# Patient Record
Sex: Male | Born: 1945 | Race: White | Hispanic: No | Marital: Married | Smoking: Never smoker
Health system: Southern US, Community
[De-identification: ages and names within clinical notes are randomized; demographics above are authoritative.]

## PROBLEM LIST (undated history)

## (undated) DIAGNOSIS — K219 Gastro-esophageal reflux disease without esophagitis: Secondary | ICD-10-CM

## (undated) DIAGNOSIS — R5383 Other fatigue: Secondary | ICD-10-CM

## (undated) DIAGNOSIS — I10 Essential (primary) hypertension: Secondary | ICD-10-CM

## (undated) DIAGNOSIS — E119 Type 2 diabetes mellitus without complications: Secondary | ICD-10-CM

## (undated) DIAGNOSIS — C801 Malignant (primary) neoplasm, unspecified: Secondary | ICD-10-CM

## (undated) HISTORY — PX: COLONOSCOPY: SHX174

## (undated) HISTORY — PX: VASECTOMY: SHX75

## (undated) HISTORY — PX: PARATHYROIDECTOMY: SHX19

---

## 2013-06-27 DIAGNOSIS — E213 Hyperparathyroidism, unspecified: Secondary | ICD-10-CM | POA: Insufficient documentation

## 2013-08-18 DIAGNOSIS — N400 Enlarged prostate without lower urinary tract symptoms: Secondary | ICD-10-CM | POA: Insufficient documentation

## 2013-08-18 DIAGNOSIS — E1165 Type 2 diabetes mellitus with hyperglycemia: Secondary | ICD-10-CM | POA: Insufficient documentation

## 2013-08-18 DIAGNOSIS — E785 Hyperlipidemia, unspecified: Secondary | ICD-10-CM | POA: Insufficient documentation

## 2015-07-04 ENCOUNTER — Encounter: Payer: Self-pay | Admitting: *Deleted

## 2015-07-04 DIAGNOSIS — K219 Gastro-esophageal reflux disease without esophagitis: Secondary | ICD-10-CM | POA: Diagnosis not present

## 2015-07-04 DIAGNOSIS — H2512 Age-related nuclear cataract, left eye: Secondary | ICD-10-CM | POA: Diagnosis not present

## 2015-07-04 DIAGNOSIS — I10 Essential (primary) hypertension: Secondary | ICD-10-CM | POA: Diagnosis not present

## 2015-07-04 DIAGNOSIS — I2510 Atherosclerotic heart disease of native coronary artery without angina pectoris: Secondary | ICD-10-CM | POA: Diagnosis not present

## 2015-07-04 DIAGNOSIS — E119 Type 2 diabetes mellitus without complications: Secondary | ICD-10-CM | POA: Diagnosis not present

## 2015-07-04 DIAGNOSIS — Z8546 Personal history of malignant neoplasm of prostate: Secondary | ICD-10-CM | POA: Diagnosis not present

## 2015-07-04 DIAGNOSIS — I252 Old myocardial infarction: Secondary | ICD-10-CM | POA: Diagnosis not present

## 2015-07-10 ENCOUNTER — Ambulatory Visit
Admission: RE | Admit: 2015-07-10 | Discharge: 2015-07-10 | Disposition: A | Discharge status: Home / Self Care | Payer: Medicare Other | Source: Ambulatory Visit | Attending: Ophthalmology | Admitting: Ophthalmology

## 2015-07-10 ENCOUNTER — Encounter
Admission: RE | Disposition: A | Discharge status: Home / Self Care | Payer: Self-pay | Source: Ambulatory Visit | Attending: Ophthalmology

## 2015-07-10 ENCOUNTER — Ambulatory Visit: Payer: Medicare Other | Admitting: Registered Nurse

## 2015-07-10 ENCOUNTER — Encounter: Payer: Self-pay | Admitting: *Deleted

## 2015-07-10 DIAGNOSIS — K219 Gastro-esophageal reflux disease without esophagitis: Secondary | ICD-10-CM | POA: Insufficient documentation

## 2015-07-10 DIAGNOSIS — H2512 Age-related nuclear cataract, left eye: Secondary | ICD-10-CM | POA: Diagnosis not present

## 2015-07-10 DIAGNOSIS — I2510 Atherosclerotic heart disease of native coronary artery without angina pectoris: Secondary | ICD-10-CM | POA: Insufficient documentation

## 2015-07-10 DIAGNOSIS — E119 Type 2 diabetes mellitus without complications: Secondary | ICD-10-CM | POA: Insufficient documentation

## 2015-07-10 DIAGNOSIS — Z8546 Personal history of malignant neoplasm of prostate: Secondary | ICD-10-CM | POA: Insufficient documentation

## 2015-07-10 DIAGNOSIS — I252 Old myocardial infarction: Secondary | ICD-10-CM | POA: Insufficient documentation

## 2015-07-10 DIAGNOSIS — I10 Essential (primary) hypertension: Secondary | ICD-10-CM | POA: Insufficient documentation

## 2015-07-10 HISTORY — DX: Essential (primary) hypertension: I10

## 2015-07-10 HISTORY — DX: Gastro-esophageal reflux disease without esophagitis: K21.9

## 2015-07-10 HISTORY — PX: CATARACT EXTRACTION W/PHACO: SHX586

## 2015-07-10 HISTORY — DX: Other fatigue: R53.83

## 2015-07-10 HISTORY — DX: Type 2 diabetes mellitus without complications: E11.9

## 2015-07-10 HISTORY — DX: Malignant (primary) neoplasm, unspecified: C80.1

## 2015-07-10 LAB — GLUCOSE, CAPILLARY: Glucose-Capillary: 159 mg/dL — ABNORMAL HIGH (ref 65–99)

## 2015-07-10 SURGERY — PHACOEMULSIFICATION, CATARACT, WITH IOL INSERTION
Anesthesia: General | Laterality: Left | Wound class: Clean

## 2015-07-10 MED ORDER — ONDANSETRON HCL 4 MG/2ML IJ SOLN
INTRAMUSCULAR | Status: DC | PRN
  Administered 2015-07-10: 4 mg via INTRAVENOUS

## 2015-07-10 MED ORDER — SODIUM CHLORIDE 0.9 % IV SOLN
INTRAVENOUS | Status: DC
  Administered 2015-07-10: 06:00:00 via INTRAVENOUS

## 2015-07-10 MED ORDER — NA CHONDROIT SULF-NA HYALURON 40-17 MG/ML IO SOLN
INTRAOCULAR | Status: DC | PRN
  Administered 2015-07-10: 1 mL via INTRAOCULAR

## 2015-07-10 MED ORDER — EPINEPHRINE HCL 1 MG/ML IJ SOLN
INTRAMUSCULAR | Status: AC
  Filled 2015-07-10: qty 1

## 2015-07-10 MED ORDER — FENTANYL CITRATE (PF) 100 MCG/2ML IJ SOLN
INTRAMUSCULAR | Status: DC | PRN
  Administered 2015-07-10: 50 ug via INTRAVENOUS

## 2015-07-10 MED ORDER — NA CHONDROIT SULF-NA HYALURON 40-17 MG/ML IO SOLN
INTRAOCULAR | Status: AC
  Filled 2015-07-10: qty 1

## 2015-07-10 MED ORDER — MOXIFLOXACIN HCL 0.5 % OP SOLN: 1.0000 [drp] | OPHTHALMIC | Status: DC | PRN

## 2015-07-10 MED ORDER — EPINEPHRINE HCL 1 MG/ML IJ SOLN
INTRAOCULAR | Status: DC | PRN
  Administered 2015-07-10: 250 mL via OPHTHALMIC

## 2015-07-10 MED ORDER — ARMC OPHTHALMIC DILATING GEL
1.0000 "application " | OPHTHALMIC | Status: AC | PRN
  Administered 2015-07-10 (×2): 1 via OPHTHALMIC

## 2015-07-10 MED ORDER — CEFUROXIME OPHTHALMIC INJECTION 1 MG/0.1 ML
INJECTION | OPHTHALMIC | Status: AC
  Filled 2015-07-10: qty 0.1

## 2015-07-10 MED ORDER — POVIDONE-IODINE 5 % OP SOLN
1.0000 "application " | OPHTHALMIC | Status: AC | PRN
  Administered 2015-07-10: 1 via OPHTHALMIC

## 2015-07-10 MED ORDER — TETRACAINE HCL 0.5 % OP SOLN
1.0000 [drp] | OPHTHALMIC | Status: AC | PRN
  Administered 2015-07-10: 1 [drp] via OPHTHALMIC

## 2015-07-10 MED ORDER — MIDAZOLAM HCL 2 MG/2ML IJ SOLN
INTRAMUSCULAR | Status: DC | PRN
  Administered 2015-07-10: 1 mg via INTRAVENOUS

## 2015-07-10 MED ORDER — MOXIFLOXACIN HCL 0.5 % OP SOLN
OPHTHALMIC | Status: DC | PRN
  Administered 2015-07-10: 2 [drp] via OPHTHALMIC

## 2015-07-10 SURGICAL SUPPLY — 21 items
CANNULA ANT/CHMB 27GA (MISCELLANEOUS) IMPLANT
CUP MEDICINE 2OZ PLAST GRAD ST (MISCELLANEOUS) IMPLANT
GLOVE BIO SURGEON STRL SZ8 (GLOVE) ×3 IMPLANT
GLOVE BIOGEL M 6.5 STRL (GLOVE) ×3 IMPLANT
GLOVE SURG LX 8.0 MICRO (GLOVE) ×2
GLOVE SURG LX STRL 8.0 MICRO (GLOVE) ×1 IMPLANT
GOWN STRL REUS W/ TWL LRG LVL3 (GOWN DISPOSABLE) ×2 IMPLANT
GOWN STRL REUS W/TWL LRG LVL3 (GOWN DISPOSABLE) ×4
LENS IOL TECNIS 14.0 (Intraocular Lens) ×3 IMPLANT
PACK CATARACT (MISCELLANEOUS) ×3 IMPLANT
PACK CATARACT BRASINGTON LX (MISCELLANEOUS) ×3 IMPLANT
PACK EYE AFTER SURG (MISCELLANEOUS) ×3 IMPLANT
SOL BSS BAG (MISCELLANEOUS) ×3
SOL PREP PVP 2OZ (MISCELLANEOUS)
SOLUTION BSS BAG (MISCELLANEOUS) ×1 IMPLANT
SOLUTION PREP PVP 2OZ (MISCELLANEOUS) IMPLANT
SYR 3ML LL SCALE MARK (SYRINGE) IMPLANT
SYR 5ML LL (SYRINGE) ×3 IMPLANT
SYR TB 1ML 27GX1/2 LL (SYRINGE) ×3 IMPLANT
WATER STERILE IRR 1000ML POUR (IV SOLUTION) ×3 IMPLANT
WIPE NON LINTING 3.25X3.25 (MISCELLANEOUS) ×3 IMPLANT

## 2015-07-10 NOTE — Anesthesia Preprocedure Evaluation (Addendum)
Anesthesia Evaluation  Patient identified by MRN, date of birth, ID band Patient awake    Reviewed: Allergy & Precautions, H&P , NPO status , Patient's Chart, lab work & pertinent test results, reviewed documented beta blocker date and time   History of Anesthesia Complications Negative for: history of anesthetic complications  Airway Mallampati: II  TM Distance: >3 FB Neck ROM: full    Dental no notable dental hx. (+) Teeth Intact   Pulmonary neg pulmonary ROS,  breath sounds clear to auscultation  Pulmonary exam normal       Cardiovascular Exercise Tolerance: Good hypertension, - angina- CAD, - Past MI, - Cardiac Stents and - CABG Normal cardiovascular exam- dysrhythmias - Valvular Problems/MurmursRhythm:regular Rate:Normal     Neuro/Psych negative neurological ROS  negative psych ROS   GI/Hepatic Neg liver ROS, GERD-  Medicated,  Endo/Other  diabetes, Oral Hypoglycemic Agents  Renal/GU negative Renal ROS  negative genitourinary   Musculoskeletal   Abdominal   Peds  Hematology negative hematology ROS (+)   Anesthesia Other Findings Past Medical History:   Hypertension                                                 Fatigue                                                        Comment:POSSIBLE SEIZURE  NO EVAL  NO FUTHER PROBLEM   GERD (gastroesophageal reflux disease)                         Comment:ULCERS   Diabetes mellitus without complication                       Cancer                                                         Comment:PROSTATE   Reproductive/Obstetrics negative OB ROS                            Anesthesia Physical Anesthesia Plan  ASA: II  Anesthesia Plan: MAC   Post-op Pain Management:    Induction:   Airway Management Planned:   Additional Equipment:   Intra-op Plan:   Post-operative Plan:   Informed Consent: I have reviewed the patients History  and Physical, chart, labs and discussed the procedure including the risks, benefits and alternatives for the proposed anesthesia with the patient or authorized representative who has indicated his/her understanding and acceptance.   Dental Advisory Given  Plan Discussed with: Anesthesiologist, CRNA and Surgeon  Anesthesia Plan Comments:        Anesthesia Quick Evaluation

## 2015-07-10 NOTE — Anesthesia Procedure Notes (Signed)
Procedure Name: MAC Date/Time: 07/10/2015 7:30 AM Performed by: Doreen Salvage Pre-anesthesia Checklist: Patient identified, Emergency Drugs available, Suction available and Patient being monitored Patient Re-evaluated:Patient Re-evaluated prior to inductionOxygen Delivery Method: Nasal cannula

## 2015-07-10 NOTE — Transfer of Care (Signed)
Immediate Anesthesia Transfer of Care Note  Patient: [place] Nails Bedore  Procedure(s) Performed: Procedure(s) with comments: CATARACT EXTRACTION PHACO AND INTRAOCULAR LENS PLACEMENT (IOC) (Left) - [place]    02:00 AP% 67.6 CDE35.48 [id number] H  Patient Location: PHASE II  Anesthesia Type:MAC  Level of Consciousness: Awake, Alert, Oriented  Airway & Oxygen Therapy: Patient Spontanous Breathing and Patient on room air   Post-op Assessment: Report given to RN and Post -op Vital signs reviewed and stable  Post vital signs: Reviewed and stable  Last Vitals:  Filed Vitals:   07/10/15 0753  BP: 123/80  Pulse: 68  Temp: 36.2 C  Resp: 16    Complications: No apparent anesthesia complications

## 2015-07-10 NOTE — H&P (Signed)
  All labs reviewed. Abnormal studies sent to patients PCP when indicated.  Previous H&P reviewed, patient examined, there are NO CHANGES.  Jesse Curtis LOUIS9/6/20167:12 AM

## 2015-07-10 NOTE — Anesthesia Postprocedure Evaluation (Signed)
  Anesthesia Post-op Note  Patient: Jesse Curtis  Procedure(s) Performed: Procedure(s) with comments: CATARACT EXTRACTION PHACO AND INTRAOCULAR LENS PLACEMENT (IOC) (Left) - [place]    02:00 AP% 67.6 CDE35.48 [id number] H  Anesthesia type:MAC  Patient location: PHASE II  Post pain: Pain level controlled  Post assessment: Post-op Vital signs reviewed, Patient's Cardiovascular Status Stable, Respiratory Function Stable, Patent Airway and No signs of Nausea or vomiting  Post vital signs: Reviewed and stable  Last Vitals:  Filed Vitals:   07/10/15 0753  BP: 123/80  Pulse: 68  Temp: 36.2 C  Resp: 16    Level of consciousness: awake, alert  and patient cooperative  Complications: No apparent anesthesia complications

## 2015-07-10 NOTE — Op Note (Signed)
PREOPERATIVE DIAGNOSIS:  Nuclear sclerotic cataract of the left eye.   POSTOPERATIVE DIAGNOSIS:  nuclear sclerotic catarct left eye   OPERATIVE PROCEDURE:  Procedure(s): CATARACT EXTRACTION PHACO AND INTRAOCULAR LENS PLACEMENT (IOC)   SURGEON:  Birder Robson, MD.   ANESTHESIA:   Anesthesiologist: Martha Clan, MD CRNA: Doreen Salvage, CRNA  1.      Managed anesthesia care. 2.      Topical tetracaine drops followed by 2% Xylocaine jelly applied in the preoperative holding area.   COMPLICATIONS:  None.   TECHNIQUE:   Stop and chop   DESCRIPTION OF PROCEDURE:  The patient was examined and consented in the preoperative holding area where the aforementioned topical anesthesia was applied to the left eye and then brought back to the Operating Room where the left eye was prepped and draped in the usual sterile ophthalmic fashion and a lid speculum was placed. A paracentesis was created with the side port blade and the anterior chamber was filled with viscoelastic. A near clear corneal incision was performed with the steel keratome. A continuous curvilinear capsulorrhexis was performed with a cystotome followed by the capsulorrhexis forceps. Hydrodissection and hydrodelineation were carried out with BSS on a blunt cannula. The lens was removed in a stop and chop  technique and the remaining cortical material was removed with the irrigation-aspiration handpiece. The capsular bag was inflated with viscoelastic and the Technis ZCB00 lens was placed in the capsular bag without complication. The remaining viscoelastic was removed from the eye with the irrigation-aspiration handpiece. The wounds were hydrated. The anterior chamber was flushed with Miostat and the eye was inflated to physiologic pressure. 0.1 mL of cefuroxime concentration 10 mg/mL was placed in the anterior chamber. The wounds were found to be water tight. The eye was dressed with Vigamox. The patient was given protective glasses to wear  throughout the day and a shield with which to sleep tonight. The patient was also given drops with which to begin a drop regimen today and will follow-up with me in one day.  Implant Name Type Inv. Item Serial No. Manufacturer Lot No. LRB No. Used  LENS IOL TECNIS 14.0 - [id number] Intraocular Lens LENS IOL TECNIS 14.0 [id number] AMO   Left 1   Procedure(s) with comments: CATARACT EXTRACTION PHACO AND INTRAOCULAR LENS PLACEMENT (IOC) (Left) - [place]    02:00 AP% 67.6 CDE35.48 [id number] H  Electronically signed: [place] 07/10/2015 7:48 AM

## 2015-07-10 NOTE — Discharge Instructions (Signed)
Eye Surgery Discharge Instructions  Expect mild scratchy sensation or mild soreness. DO NOT RUB YOUR EYE!  The day of surgery:  Minimal physical activity, but bed rest is not required  No reading, computer work, or close hand work  No bending, lifting, or straining.  May watch TV  For 24 hours:  No driving, legal decisions, or alcoholic beverages  Safety precautions  Eat anything you prefer: It is better to start with liquids, then soup then solid foods.  _____ Eye patch should be worn until postoperative exam tomorrow.  ____ Solar shield eyeglasses should be worn for comfort in the sunlight/patch while sleeping  Resume all regular medications including aspirin or Coumadin if these were discontinued prior to surgery. You may shower, bathe, shave, or wash your hair. Tylenol may be taken for mild discomfort.  Call your doctor if you experience significant pain, nausea, or vomiting, fever > 101 or other signs of infection. [phone] or [phone] Specific instructions:  Follow-up Information    Please follow up.   Why:  Tomorrow Sept. 7th at 10:00 am

## 2015-07-26 ENCOUNTER — Encounter: Payer: Self-pay | Admitting: *Deleted

## 2015-07-31 ENCOUNTER — Ambulatory Visit: Payer: Medicare Other | Admitting: Anesthesiology

## 2015-07-31 ENCOUNTER — Ambulatory Visit
Admission: RE | Admit: 2015-07-31 | Discharge: 2015-07-31 | Disposition: A | Discharge status: Home / Self Care | Payer: Medicare Other | Source: Ambulatory Visit | Attending: Ophthalmology | Admitting: Ophthalmology

## 2015-07-31 ENCOUNTER — Encounter: Payer: Self-pay | Admitting: *Deleted

## 2015-07-31 ENCOUNTER — Encounter
Admission: RE | Disposition: A | Discharge status: Home / Self Care | Payer: Self-pay | Source: Ambulatory Visit | Attending: Ophthalmology

## 2015-07-31 DIAGNOSIS — K219 Gastro-esophageal reflux disease without esophagitis: Secondary | ICD-10-CM | POA: Insufficient documentation

## 2015-07-31 DIAGNOSIS — I10 Essential (primary) hypertension: Secondary | ICD-10-CM | POA: Diagnosis not present

## 2015-07-31 DIAGNOSIS — E785 Hyperlipidemia, unspecified: Secondary | ICD-10-CM | POA: Diagnosis not present

## 2015-07-31 DIAGNOSIS — R5383 Other fatigue: Secondary | ICD-10-CM | POA: Insufficient documentation

## 2015-07-31 DIAGNOSIS — Z9852 Vasectomy status: Secondary | ICD-10-CM | POA: Diagnosis not present

## 2015-07-31 DIAGNOSIS — E119 Type 2 diabetes mellitus without complications: Secondary | ICD-10-CM | POA: Diagnosis not present

## 2015-07-31 DIAGNOSIS — Z8546 Personal history of malignant neoplasm of prostate: Secondary | ICD-10-CM | POA: Insufficient documentation

## 2015-07-31 DIAGNOSIS — H2511 Age-related nuclear cataract, right eye: Secondary | ICD-10-CM | POA: Insufficient documentation

## 2015-07-31 DIAGNOSIS — Z9842 Cataract extraction status, left eye: Secondary | ICD-10-CM | POA: Diagnosis not present

## 2015-07-31 HISTORY — PX: CATARACT EXTRACTION W/PHACO: SHX586

## 2015-07-31 LAB — GLUCOSE, CAPILLARY: Glucose-Capillary: 114 mg/dL — ABNORMAL HIGH (ref 65–99)

## 2015-07-31 SURGERY — PHACOEMULSIFICATION, CATARACT, WITH IOL INSERTION
Anesthesia: Monitor Anesthesia Care | Laterality: Right | Wound class: Clean

## 2015-07-31 MED ORDER — POVIDONE-IODINE 5 % OP SOLN
OPHTHALMIC | Status: AC
  Administered 2015-07-31: 1 via OPHTHALMIC
  Filled 2015-07-31: qty 30

## 2015-07-31 MED ORDER — ARMC OPHTHALMIC DILATING GEL
OPHTHALMIC | Status: AC
  Administered 2015-07-31: 1 via OPHTHALMIC
  Filled 2015-07-31: qty 0.25

## 2015-07-31 MED ORDER — CEFUROXIME OPHTHALMIC INJECTION 1 MG/0.1 ML
INJECTION | OPHTHALMIC | Status: AC
  Filled 2015-07-31: qty 0.1

## 2015-07-31 MED ORDER — NA CHONDROIT SULF-NA HYALURON 40-17 MG/ML IO SOLN
INTRAOCULAR | Status: AC
  Filled 2015-07-31: qty 1

## 2015-07-31 MED ORDER — EPINEPHRINE HCL 1 MG/ML IJ SOLN
INTRAMUSCULAR | Status: AC
  Filled 2015-07-31: qty 1

## 2015-07-31 MED ORDER — NA CHONDROIT SULF-NA HYALURON 40-17 MG/ML IO SOLN
INTRAOCULAR | Status: DC | PRN
Start: 2015-07-31 — End: 2015-07-31
  Administered 2015-07-31: 1 mL via INTRAOCULAR

## 2015-07-31 MED ORDER — MOXIFLOXACIN HCL 0.5 % OP SOLN
OPHTHALMIC | Status: DC | PRN
  Administered 2015-07-31: 1 [drp]

## 2015-07-31 MED ORDER — POVIDONE-IODINE 5 % OP SOLN
1.0000 "application " | OPHTHALMIC | Status: AC | PRN
  Administered 2015-07-31: 1 via OPHTHALMIC

## 2015-07-31 MED ORDER — MIDAZOLAM HCL 2 MG/2ML IJ SOLN
INTRAMUSCULAR | Status: DC | PRN
  Administered 2015-07-31: 2 mg via INTRAVENOUS

## 2015-07-31 MED ORDER — MOXIFLOXACIN HCL 0.5 % OP SOLN
OPHTHALMIC | Status: AC
  Filled 2015-07-31: qty 3

## 2015-07-31 MED ORDER — ARMC OPHTHALMIC DILATING GEL
1.0000 "application " | OPHTHALMIC | Status: DC | PRN
  Administered 2015-07-31: 1 via OPHTHALMIC

## 2015-07-31 MED ORDER — EPINEPHRINE HCL 1 MG/ML IJ SOLN
INTRAOCULAR | Status: DC | PRN
  Administered 2015-07-31: 200 mL via OPHTHALMIC

## 2015-07-31 MED ORDER — SODIUM CHLORIDE 0.9 % IV SOLN
INTRAVENOUS | Status: DC
  Administered 2015-07-31 (×2): via INTRAVENOUS

## 2015-07-31 MED ORDER — CEFUROXIME OPHTHALMIC INJECTION 1 MG/0.1 ML
INJECTION | OPHTHALMIC | Status: DC | PRN
  Administered 2015-07-31: 1 mg via INTRACAMERAL

## 2015-07-31 MED ORDER — TETRACAINE HCL 0.5 % OP SOLN
1.0000 [drp] | OPHTHALMIC | Status: AC | PRN
  Administered 2015-07-31: 1 [drp] via OPHTHALMIC

## 2015-07-31 MED ORDER — TETRACAINE HCL 0.5 % OP SOLN
OPHTHALMIC | Status: AC
  Administered 2015-07-31: 1 [drp] via OPHTHALMIC
  Filled 2015-07-31: qty 2

## 2015-07-31 SURGICAL SUPPLY — 22 items
CANNULA ANT/CHMB 27GA (MISCELLANEOUS) ×3 IMPLANT
CUP MEDICINE 2OZ PLAST GRAD ST (MISCELLANEOUS) ×3 IMPLANT
GLOVE BIO SURGEON STRL SZ8 (GLOVE) ×3 IMPLANT
GLOVE BIOGEL M 6.5 STRL (GLOVE) ×3 IMPLANT
GLOVE SURG LX 8.0 MICRO (GLOVE) ×2
GLOVE SURG LX STRL 8.0 MICRO (GLOVE) ×1 IMPLANT
GOWN STRL REUS W/ TWL LRG LVL3 (GOWN DISPOSABLE) ×2 IMPLANT
GOWN STRL REUS W/TWL LRG LVL3 (GOWN DISPOSABLE) ×4
LENS IOL TECNIS 15.0 (Intraocular Lens) ×3 IMPLANT
LENS IOL TECNIS MONO 1P 15.0 (Intraocular Lens) ×1 IMPLANT
PACK CATARACT (MISCELLANEOUS) ×3 IMPLANT
PACK CATARACT BRASINGTON LX (MISCELLANEOUS) ×3 IMPLANT
PACK EYE AFTER SURG (MISCELLANEOUS) ×3 IMPLANT
SOL BSS BAG (MISCELLANEOUS) ×3
SOL PREP PVP 2OZ (MISCELLANEOUS) ×3
SOLUTION BSS BAG (MISCELLANEOUS) ×1 IMPLANT
SOLUTION PREP PVP 2OZ (MISCELLANEOUS) ×1 IMPLANT
SYR 3ML LL SCALE MARK (SYRINGE) ×3 IMPLANT
SYR 5ML LL (SYRINGE) ×3 IMPLANT
SYR TB 1ML 27GX1/2 LL (SYRINGE) ×3 IMPLANT
WATER STERILE IRR 1000ML POUR (IV SOLUTION) ×3 IMPLANT
WIPE NON LINTING 3.25X3.25 (MISCELLANEOUS) ×3 IMPLANT

## 2015-07-31 NOTE — Anesthesia Preprocedure Evaluation (Signed)
Anesthesia Evaluation  Patient identified by MRN, date of birth, ID band Patient awake    Reviewed: Allergy & Precautions, H&P , NPO status , Patient's Chart, lab work & pertinent test results, reviewed documented beta blocker date and time   History of Anesthesia Complications Negative for: history of anesthetic complications  Airway Mallampati: II  TM Distance: >3 FB Neck ROM: full    Dental no notable dental hx. (+) Teeth Intact   Pulmonary neg pulmonary ROS,    Pulmonary exam normal breath sounds clear to auscultation       Cardiovascular Exercise Tolerance: Good hypertension, (-) angina(-) CAD, (-) Past MI, (-) Cardiac Stents and (-) CABG Normal cardiovascular exam(-) dysrhythmias (-) Valvular Problems/Murmurs Rhythm:regular Rate:Normal     Neuro/Psych negative neurological ROS  negative psych ROS   GI/Hepatic Neg liver ROS, GERD  Medicated,  Endo/Other  diabetes, Oral Hypoglycemic Agents  Renal/GU negative Renal ROS  negative genitourinary   Musculoskeletal   Abdominal   Peds  Hematology negative hematology ROS (+)   Anesthesia Other Findings Past Medical History:   Hypertension                                                 Fatigue                                                        Comment:POSSIBLE SEIZURE  NO EVAL  NO FUTHER PROBLEM   GERD (gastroesophageal reflux disease)                         Comment:ULCERS   Diabetes mellitus without complication                       Cancer                                                         Comment:PROSTATE   Reproductive/Obstetrics negative OB ROS                             Anesthesia Physical  Anesthesia Plan  ASA: II  Anesthesia Plan: MAC   Post-op Pain Management:    Induction:   Airway Management Planned:   Additional Equipment:   Intra-op Plan:   Post-operative Plan:   Informed Consent: I have reviewed  the patients History and Physical, chart, labs and discussed the procedure including the risks, benefits and alternatives for the proposed anesthesia with the patient or authorized representative who has indicated his/her understanding and acceptance.   Dental Advisory Given  Plan Discussed with: Anesthesiologist, CRNA and Surgeon  Anesthesia Plan Comments:         Anesthesia Quick Evaluation

## 2015-07-31 NOTE — H&P (Signed)
  All labs reviewed. Abnormal studies sent to patients PCP when indicated.  Previous H&P reviewed, patient examined, there are NO CHANGES.  Jesse Curtis,Jesse LOUIS9/27/20169:28 AM

## 2015-07-31 NOTE — Discharge Instructions (Signed)
Eye Surgery Discharge Instructions  Expect mild scratchy sensation or mild soreness. DO NOT RUB YOUR EYE!  The day of surgery:  Minimal physical activity, but bed rest is not required  No reading, computer work, or close hand work  No bending, lifting, or straining.  May watch TV  For 24 hours:  No driving, legal decisions, or alcoholic beverages  Safety precautions  Eat anything you prefer: It is better to start with liquids, then soup then solid foods.  _____ Eye patch should be worn until postoperative exam tomorrow.  ____ Solar shield eyeglasses should be worn for comfort in the sunlight/patch while sleeping  Resume all regular medications including aspirin or Coumadin if these were discontinued prior to surgery. You may shower, bathe, shave, or wash your hair. Tylenol may be taken for mild discomfort.  Call your doctor if you experience significant pain, nausea, or vomiting, fever > 101 or other signs of infection. [phone] or [phone] Specific instructions:  Follow-up Information    Follow up with Tim Lair, MD.   Specialty:  Ophthalmology   Why:  tomorrow Sept 28th at 10:50   Contact information:   [address] [place] [address] [phone]

## 2015-07-31 NOTE — Transfer of Care (Signed)
Immediate Anesthesia Transfer of Care Note  Patient: Jesse Curtis  Procedure(s) Performed: Procedure(s) with comments: CATARACT EXTRACTION PHACO AND INTRAOCULAR LENS PLACEMENT (IOC) (Right) - us02:17.1 ap 26.9 cde 36.87  fluid lot # [id number]  Patient Location: PACU  Anesthesia Type:MAC  Level of Consciousness: awake  Airway & Oxygen Therapy: Patient Spontanous Breathing  Post-op Assessment: Report given to RN  Post vital signs: Reviewed  Last Vitals:  Filed Vitals:   07/31/15 0817  BP: 110/76  Pulse: 74  Temp: 36.8 C  Resp: 16    Complications: No apparent anesthesia complications

## 2015-07-31 NOTE — Op Note (Signed)
PREOPERATIVE DIAGNOSIS:  Nuclear sclerotic cataract of the right eye.   POSTOPERATIVE DIAGNOSIS: cataract right eye   OPERATIVE PROCEDURE:  Procedure(s): CATARACT EXTRACTION PHACO AND INTRAOCULAR LENS PLACEMENT (IOC)   SURGEON:  Birder Robson, MD.   ANESTHESIA:  Anesthesiologist: Martha Clan, MD CRNA: Sinda Du, CRNA  1.      Managed anesthesia care. 2.      Topical tetracaine drops followed by 2% Xylocaine jelly applied in the preoperative holding area.   COMPLICATIONS:  None.   TECHNIQUE:   Stop and chop   DESCRIPTION OF PROCEDURE:  The patient was examined and consented in the preoperative holding area where the aforementioned topical anesthesia was applied to the right eye and then brought back to the Operating Room where the right eye was prepped and draped in the usual sterile ophthalmic fashion and a lid speculum was placed. A paracentesis was created with the side port blade and the anterior chamber was filled with viscoelastic. A near clear corneal incision was performed with the steel keratome. A continuous curvilinear capsulorrhexis was performed with a cystotome followed by the capsulorrhexis forceps. Hydrodissection and hydrodelineation were carried out with BSS on a blunt cannula. The lens was removed in a stop and chop  technique and the remaining cortical material was removed with the irrigation-aspiration handpiece. The capsular bag was inflated with viscoelastic and the Technis ZCB00  lens was placed in the capsular bag without complication. The remaining viscoelastic was removed from the eye with the irrigation-aspiration handpiece. The wounds were hydrated. The anterior chamber was flushed with Miostat and the eye was inflated to physiologic pressure. 0.1 mL of cefuroxime concentration 10 mg/mL was placed in the anterior chamber. The wounds were found to be water tight. The eye was dressed with Vigamox. The patient was given protective glasses to wear throughout the day  and a shield with which to sleep tonight. The patient was also given drops with which to begin a drop regimen today and will follow-up with me in one day.  Implant Name Type Inv. Item Serial No. Manufacturer Lot No. LRB No. Used  LENS IMPL INTRAOC ZCB00 15.0 - [id number] Intraocular Lens LENS IMPL INTRAOC ZCB00 15.0 [id number] AMO   Right 1   Procedure(s) with comments: CATARACT EXTRACTION PHACO AND INTRAOCULAR LENS PLACEMENT (IOC) (Right) - us02:17.1 ap 26.9 cde 36.87  fluid lot # [id number]  Electronically signed: PORFILIO,WILLIAM LOUIS 07/31/2015 9:55 AM

## 2015-08-01 NOTE — Anesthesia Postprocedure Evaluation (Signed)
  Anesthesia Post-op Note  Patient: Jesse Curtis  Procedure(s) Performed: Procedure(s) with comments: CATARACT EXTRACTION PHACO AND INTRAOCULAR LENS PLACEMENT (IOC) (Right) - us02:17.1 ap 26.9 cde 36.87  fluid lot # [id number] h  Anesthesia type:MAC  Patient location: PACU  Post pain: Pain level controlled  Post assessment: Post-op Vital signs reviewed, Patient's Cardiovascular Status Stable, Respiratory Function Stable, Patent Airway and No signs of Nausea or vomiting  Post vital signs: Reviewed and stable  Last Vitals:  Filed Vitals:   07/31/15 1020  BP: 109/70  Pulse: 71  Temp:   Resp: 16    Level of consciousness: awake, alert  and patient cooperative  Complications: No apparent anesthesia complications

## 2017-08-14 ENCOUNTER — Ambulatory Visit: Payer: Medicare Other | Admitting: Anesthesiology

## 2017-08-14 ENCOUNTER — Encounter
Admission: RE | Disposition: A | Discharge status: Home / Self Care | Payer: Self-pay | Source: Ambulatory Visit | Attending: Unknown Physician Specialty

## 2017-08-14 ENCOUNTER — Ambulatory Visit
Admission: RE | Admit: 2017-08-14 | Discharge: 2017-08-14 | Disposition: A | Discharge status: Home / Self Care | Payer: Medicare Other | Source: Ambulatory Visit | Attending: Unknown Physician Specialty | Admitting: Unknown Physician Specialty

## 2017-08-14 DIAGNOSIS — Z8546 Personal history of malignant neoplasm of prostate: Secondary | ICD-10-CM | POA: Diagnosis not present

## 2017-08-14 DIAGNOSIS — K219 Gastro-esophageal reflux disease without esophagitis: Secondary | ICD-10-CM | POA: Diagnosis not present

## 2017-08-14 DIAGNOSIS — Z79899 Other long term (current) drug therapy: Secondary | ICD-10-CM | POA: Insufficient documentation

## 2017-08-14 DIAGNOSIS — K635 Polyp of colon: Secondary | ICD-10-CM | POA: Insufficient documentation

## 2017-08-14 DIAGNOSIS — K5730 Diverticulosis of large intestine without perforation or abscess without bleeding: Secondary | ICD-10-CM | POA: Diagnosis not present

## 2017-08-14 DIAGNOSIS — E119 Type 2 diabetes mellitus without complications: Secondary | ICD-10-CM | POA: Insufficient documentation

## 2017-08-14 DIAGNOSIS — Z7984 Long term (current) use of oral hypoglycemic drugs: Secondary | ICD-10-CM | POA: Insufficient documentation

## 2017-08-14 DIAGNOSIS — K621 Rectal polyp: Secondary | ICD-10-CM | POA: Insufficient documentation

## 2017-08-14 DIAGNOSIS — K640 First degree hemorrhoids: Secondary | ICD-10-CM | POA: Diagnosis not present

## 2017-08-14 DIAGNOSIS — Z1211 Encounter for screening for malignant neoplasm of colon: Secondary | ICD-10-CM | POA: Insufficient documentation

## 2017-08-14 DIAGNOSIS — I10 Essential (primary) hypertension: Secondary | ICD-10-CM | POA: Diagnosis not present

## 2017-08-14 HISTORY — PX: COLONOSCOPY WITH PROPOFOL: SHX5780

## 2017-08-14 LAB — GLUCOSE, CAPILLARY: Glucose-Capillary: 138 mg/dL — ABNORMAL HIGH (ref 65–99)

## 2017-08-14 SURGERY — COLONOSCOPY WITH PROPOFOL
Anesthesia: General

## 2017-08-14 MED ORDER — SODIUM CHLORIDE 0.9 % IV SOLN: INTRAVENOUS | Status: DC

## 2017-08-14 MED ORDER — FENTANYL CITRATE (PF) 100 MCG/2ML IJ SOLN
INTRAMUSCULAR | Status: AC
  Filled 2017-08-14: qty 2

## 2017-08-14 MED ORDER — PROPOFOL 500 MG/50ML IV EMUL
INTRAVENOUS | Status: DC | PRN
  Administered 2017-08-14: 160 ug/kg/min via INTRAVENOUS

## 2017-08-14 MED ORDER — FENTANYL CITRATE (PF) 100 MCG/2ML IJ SOLN
INTRAMUSCULAR | Status: DC | PRN
  Administered 2017-08-14: 50 ug via INTRAVENOUS

## 2017-08-14 MED ORDER — PROPOFOL 500 MG/50ML IV EMUL
INTRAVENOUS | Status: AC
  Filled 2017-08-14: qty 50

## 2017-08-14 MED ORDER — PROPOFOL 10 MG/ML IV BOLUS
INTRAVENOUS | Status: AC
  Filled 2017-08-14: qty 20

## 2017-08-14 MED ORDER — SODIUM CHLORIDE 0.9 % IV SOLN
INTRAVENOUS | Status: DC
  Administered 2017-08-14 (×2): via INTRAVENOUS

## 2017-08-14 MED ORDER — LIDOCAINE 2% (20 MG/ML) 5 ML SYRINGE
INTRAMUSCULAR | Status: DC | PRN
  Administered 2017-08-14: 40 mg via INTRAVENOUS

## 2017-08-14 MED ORDER — PROPOFOL 10 MG/ML IV BOLUS
INTRAVENOUS | Status: DC | PRN
  Administered 2017-08-14: 100 mg via INTRAVENOUS

## 2017-08-14 MED ORDER — LIDOCAINE HCL (PF) 2 % IJ SOLN
INTRAMUSCULAR | Status: AC
  Filled 2017-08-14: qty 10

## 2017-08-14 MED ORDER — PHENYLEPHRINE HCL 10 MG/ML IJ SOLN
INTRAMUSCULAR | Status: DC | PRN
  Administered 2017-08-14: 100 ug via INTRAVENOUS

## 2017-08-14 NOTE — Op Note (Signed)
[facility]astroenterology Patient Name: Jesse Curtis Procedure Date: 08/14/2017 8:12 AM MRN: [id number] Account #: [id number] Date of Birth: 07-01-1946 Admit Type: Outpatient Age: 71 Room: [facility] ENDO ROOM 3 Gender: Male Note Status: Finalized Procedure:            Colonoscopy Indications:          Screening for colorectal malignant neoplasm Providers:            Manya Silvas, MD Referring MD:         Tracie Harrier, MD (Referring MD) Medicines:            Propofol per Anesthesia Complications:        No immediate complications. Procedure:            Pre-Anesthesia Assessment:                       - After reviewing the risks and benefits, the patient                        was deemed in satisfactory condition to undergo the                        procedure.                       After obtaining informed consent, the colonoscope was                        passed under direct vision. Throughout the procedure,                        the patient's blood pressure, pulse, and oxygen                        saturations were monitored continuously. The                        Colonoscope was introduced through the anus and                        advanced to the the cecum, identified by appendiceal                        orifice and ileocecal valve. The colonoscopy was                        performed without difficulty. The patient tolerated the                        procedure well. The quality of the bowel preparation                        was excellent. Findings:      A small polyp was found in the hepatic flexure. The polyp was sessile.       The polyp was removed with a hot snare. Resection and retrieval were       complete.      A diminutive polyp was found in the rectum. The polyp was sessile. The       polyp was removed with a jumbo cold forceps. Resection and retrieval  were complete.      A few small-mouthed diverticula were found in the  sigmoid colon.      Internal hemorrhoids were found during endoscopy. The hemorrhoids were       small and Grade I (internal hemorrhoids that do not prolapse).      The exam was otherwise without abnormality. Impression:           - One small polyp at the hepatic flexure, removed with                        a hot snare. Resected and retrieved.                       - One diminutive polyp in the rectum, removed with a                        jumbo cold forceps. Resected and retrieved.                       - Diverticulosis in the sigmoid colon.                       - Internal hemorrhoids.                       - The examination was otherwise normal. Recommendation:       - Await pathology results. Manya Silvas, MD 08/14/2017 8:42:41 AM This report has been signed electronically. Number of Addenda: 0 Note Initiated On: 08/14/2017 8:12 AM Scope Withdrawal Time: 0 hours 11 minutes 56 seconds  Total Procedure Duration: 0 hours 20 minutes 14 seconds       [facility]

## 2017-08-14 NOTE — H&P (Signed)
Primary Care Physician:  Tracie Harrier, MD Primary Gastroenterologist:  Dr. Vira Agar  Pre-Procedure History & Physical: HPI:  Jesse Curtis is a 71 y.o. male is here for an colonoscopy.   Past Medical History:  Diagnosis Date  . Cancer ([place])    PROSTATE  . Diabetes mellitus without complication ([place])   . Fatigue    POSSIBLE SEIZURE  NO EVAL  NO FUTHER PROBLEM  . GERD (gastroesophageal reflux disease)    ULCERS  . Hypertension     Past Surgical History:  Procedure Laterality Date  . CATARACT EXTRACTION W/PHACO Left 07/10/2015   Procedure: CATARACT EXTRACTION PHACO AND INTRAOCULAR LENS PLACEMENT (IOC);  Surgeon: Birder Robson, MD;  Location: [facility] ORS;  Service: Ophthalmology;  Laterality: Left;  [place]    02:00 AP% 67.6 CDE35.48 [id number] H  . CATARACT EXTRACTION W/PHACO Right 07/31/2015   Procedure: CATARACT EXTRACTION PHACO AND INTRAOCULAR LENS PLACEMENT (IOC);  Surgeon: Birder Robson, MD;  Location: [facility] ORS;  Service: Ophthalmology;  Laterality: Right;  us02:17.1 ap 26.9 cde 36.87  fluid lot # [id number] h  . COLONOSCOPY    . PARATHYROIDECTOMY    . VASECTOMY      Prior to Admission medications   Medication Sig Start Date End Date Taking? Authorizing Provider  atorvastatin (LIPITOR) 40 MG tablet Take 40 mg by mouth daily.   Yes [provider]  finasteride (PROSCAR) 5 MG tablet Take 5 mg by mouth daily.   Yes [provider]  glimepiride (AMARYL) 2 MG tablet Take 2 mg by mouth daily with breakfast.   Yes [provider]  lisinopril (PRINIVIL,ZESTRIL) 10 MG tablet Take 10 mg by mouth daily.   Yes [provider]  omeprazole (PRILOSEC OTC) 20 MG tablet Take 20 mg by mouth every other day.   Yes [provider]  metFORMIN (GLUCOPHAGE) 1000 MG tablet Take 1,000 mg by mouth 2 (two) times daily with a meal.    [provider]  Multiple Vitamin (MULTIVITAMIN) capsule Take 1 capsule by mouth daily.    [provider]  Omega-3 Fatty Acids (FISH OIL CONCENTRATE PO) Take by mouth.    [provider]    Allergies as of 04/27/2017  . (No Known Allergies)    History reviewed. No pertinent family history.  Social History   Social History  . Marital status: Married    Spouse name: N/A  . Number of children: N/A  . Years of education: N/A   Occupational History  . Not on file.   Social History Main Topics  . Smoking status: Never Smoker  . Smokeless tobacco: Never Used  . Alcohol use No  . Drug use: No  . Sexual activity: Not on file   Other Topics Concern  . Not on file   Social History Narrative  . No narrative on file    Review of Systems: See HPI, otherwise negative ROS  Physical Exam: BP 121/82   Pulse 87   Temp (!) 96.1 F (35.6 C) (Tympanic)   Resp 18   Ht 5\' 5"  (1.651 m)   Wt 72.6 kg (160 lb)   SpO2 100%   BMI 26.63 kg/m  General:   Alert,  pleasant and cooperative in NAD Head:  Normocephalic and atraumatic. Neck:  Supple; no masses or thyromegaly. Lungs:  Clear throughout to auscultation.    Heart:  Regular rate and rhythm. Abdomen:  Soft, nontender and nondistended. Normal bowel sounds, without guarding, and without rebound.   Neurologic:  Alert and  oriented x4;  grossly normal neurologically.  Impression/Plan: Jesse Curtis is here for an colonoscopy to be performed for screening colonoscopy.  Risks, benefits, limitations, and alternatives regarding  colonoscopy have been reviewed with the patient.  Questions have been answered.  All parties agreeable.   Gaylyn Cheers, MD  08/14/2017, 8:11 AM

## 2017-08-14 NOTE — Transfer of Care (Signed)
Immediate Anesthesia Transfer of Care Note  Patient: Jesse Curtis  Procedure(s) Performed: COLONOSCOPY WITH PROPOFOL (N/A )  Patient Location: PACU and Endoscopy Unit  Anesthesia Type:General  Level of Consciousness: sedated  Airway & Oxygen Therapy: Patient Spontanous Breathing and Patient connected to nasal cannula oxygen  Post-op Assessment: Report given to RN and Post -op Vital signs reviewed and stable  Post vital signs: Reviewed and stable  Last Vitals:  Vitals:   08/14/17 0739  BP: 121/82  Pulse: 87  Resp: 18  Temp: (!) 35.6 C  SpO2: 100%    Last Pain:  Vitals:   08/14/17 0739  TempSrc: Tympanic         Complications: No apparent anesthesia complications

## 2017-08-14 NOTE — Anesthesia Preprocedure Evaluation (Signed)
Anesthesia Evaluation  Patient identified by MRN, date of birth, ID band Patient awake    Reviewed: Allergy & Precautions, NPO status , Patient's Chart, lab work & pertinent test results  History of Anesthesia Complications Negative for: history of anesthetic complications  Airway Mallampati: II       Dental   Pulmonary neg sleep apnea, neg COPD,           Cardiovascular hypertension, Pt. on medications (-) Past MI and (-) CHF (-) dysrhythmias (-) Valvular Problems/Murmurs     Neuro/Psych neg Seizures    GI/Hepatic Neg liver ROS, GERD  Medicated and Controlled,  Endo/Other  diabetes, Type 2, Oral Hypoglycemic Agents  Renal/GU negative Renal ROS     Musculoskeletal   Abdominal   Peds  Hematology   Anesthesia Other Findings   Reproductive/Obstetrics                             Anesthesia Physical Anesthesia Plan  ASA: III  Anesthesia Plan: General   Post-op Pain Management:    Induction: Intravenous  PONV Risk Score and Plan: Propofol infusion  Airway Management Planned:   Additional Equipment:   Intra-op Plan:   Post-operative Plan:   Informed Consent: I have reviewed the patients History and Physical, chart, labs and discussed the procedure including the risks, benefits and alternatives for the proposed anesthesia with the patient or authorized representative who has indicated his/her understanding and acceptance.     Plan Discussed with:   Anesthesia Plan Comments:         Anesthesia Quick Evaluation

## 2017-08-14 NOTE — Anesthesia Postprocedure Evaluation (Signed)
Anesthesia Post Note  Patient: Jesse Curtis  Procedure(s) Performed: COLONOSCOPY WITH PROPOFOL (N/A )  Patient location during evaluation: Endoscopy Anesthesia Type: General Level of consciousness: awake and alert Pain management: pain level controlled Vital Signs Assessment: post-procedure vital signs reviewed and stable Respiratory status: spontaneous breathing and respiratory function stable Cardiovascular status: stable Anesthetic complications: no     Last Vitals:  Vitals:   08/14/17 0851 08/14/17 0901  BP: 102/67 113/80  Pulse: 72 77  Resp: 12 17  Temp:    SpO2: 95% 95%    Last Pain:  Vitals:   08/14/17 0841  TempSrc: Tympanic                 KEPHART,WILLIAM K

## 2017-08-14 NOTE — Anesthesia Post-op Follow-up Note (Signed)
Anesthesia QCDR form completed.        

## 2017-08-17 ENCOUNTER — Encounter: Payer: Self-pay | Admitting: Unknown Physician Specialty

## 2017-08-17 LAB — SURGICAL PATHOLOGY

## 2017-11-30 ENCOUNTER — Other Ambulatory Visit: Payer: Self-pay | Admitting: Student

## 2017-11-30 DIAGNOSIS — M7521 Bicipital tendinitis, right shoulder: Secondary | ICD-10-CM

## 2017-12-10 ENCOUNTER — Ambulatory Visit
Admission: RE | Admit: 2017-12-10 | Discharge: 2017-12-10 | Disposition: A | Discharge status: Home / Self Care | Payer: Medicare Other | Source: Ambulatory Visit | Attending: Student | Admitting: Student

## 2017-12-10 DIAGNOSIS — M19011 Primary osteoarthritis, right shoulder: Secondary | ICD-10-CM | POA: Diagnosis not present

## 2017-12-10 DIAGNOSIS — S43001A Unspecified subluxation of right shoulder joint, initial encounter: Secondary | ICD-10-CM | POA: Diagnosis not present

## 2017-12-10 DIAGNOSIS — M7521 Bicipital tendinitis, right shoulder: Secondary | ICD-10-CM | POA: Diagnosis present

## 2017-12-10 DIAGNOSIS — M7551 Bursitis of right shoulder: Secondary | ICD-10-CM | POA: Diagnosis not present

## 2017-12-10 DIAGNOSIS — S46011A Strain of muscle(s) and tendon(s) of the rotator cuff of right shoulder, initial encounter: Secondary | ICD-10-CM | POA: Insufficient documentation

## 2017-12-10 DIAGNOSIS — X58XXXA Exposure to other specified factors, initial encounter: Secondary | ICD-10-CM | POA: Diagnosis not present

## 2017-12-18 DIAGNOSIS — S46011A Strain of muscle(s) and tendon(s) of the rotator cuff of right shoulder, initial encounter: Secondary | ICD-10-CM | POA: Insufficient documentation

## 2018-02-16 ENCOUNTER — Encounter
Admission: RE | Admit: 2018-02-16 | Discharge: 2018-02-16 | Disposition: A | Discharge status: Home / Self Care | Payer: Medicare Other | Source: Ambulatory Visit | Attending: Surgery | Admitting: Surgery

## 2018-02-16 ENCOUNTER — Other Ambulatory Visit: Payer: Self-pay

## 2018-02-16 DIAGNOSIS — R9431 Abnormal electrocardiogram [ECG] [EKG]: Secondary | ICD-10-CM | POA: Insufficient documentation

## 2018-02-16 DIAGNOSIS — Z01818 Encounter for other preprocedural examination: Secondary | ICD-10-CM | POA: Insufficient documentation

## 2018-02-16 DIAGNOSIS — I10 Essential (primary) hypertension: Secondary | ICD-10-CM | POA: Insufficient documentation

## 2018-02-16 NOTE — Patient Instructions (Signed)
Your procedure is scheduled on: 02/23/18 Tues Report to Same Day Surgery 2nd floor medical mall [facility] Entrance-take elevator on left to 2nd floor.  Check in with surgery information desk.) To find out your arrival time please call [phone] between 1PM - 3PM on 02/22/18 Mon  Remember: Instructions that are not followed completely may result in serious medical risk, up to and including death, or upon the discretion of your surgeon and anesthesiologist your surgery may need to be rescheduled.    _x___ 1. Do not eat food after midnight the night before your procedure. You may drink clear liquids up to 2 hours before you are scheduled to arrive at the hospital for your procedure.  Do not drink clear liquids within 2 hours of your scheduled arrival to the hospital.  Clear liquids include  --Water or Apple juice without pulp  --Clear carbohydrate beverage such as ClearFast or Gatorade  --Black Coffee or Clear Tea (No milk, no creamers, do not add anything to                  the coffee or Tea Type 1 and type 2 diabetics should only drink water.  No gum chewing or hard candies.     __x__ 2. No Alcohol for 24 hours before or after surgery.   __x__3. No Smoking or e-cigarettes for 24 prior to surgery.  Do not use any chewable tobacco products for at least 6 hour prior to surgery   ____  4. Bring all medications with you on the day of surgery if instructed.    __x__ 5. Notify your doctor if there is any change in your medical condition     (cold, fever, infections).    x___6. On the morning of surgery brush your teeth with toothpaste and water.  You may rinse your mouth with mouth wash if you wish.  Do not swallow any toothpaste or mouthwash.   Do not wear jewelry, make-up, hairpins, clips or nail polish.  Do not wear lotions, powders, or perfumes. You may wear deodorant.  Do not shave 48 hours prior to surgery. Men may shave face and neck.  Do not bring valuables to the hospital.     [facility] is not responsible for any belongings or valuables.               Contacts, dentures or bridgework may not be worn into surgery.  Leave your suitcase in the car. After surgery it may be brought to your room.  For patients admitted to the hospital, discharge time is determined by your                       treatment team.  _  Patients discharged the day of surgery will not be allowed to drive home.  You will need someone to drive you home and stay with you the night of your procedure.    Please read over the following fact sheets that you were given:   [facility] Preparing for Surgery and or MRSA Information   _x___ Take anti-hypertensive listed below, cardiac, seizure, asthma,     anti-reflux and psychiatric medicines. These include:  1. omeprazole (PRILOSEC OTC) 20 MG tablet  2.  3.  4.  5.  6.  ____Fleets enema or Magnesium Citrate as directed.   _x___ Use CHG Soap or sage wipes as directed on instruction sheet   ____ Use inhalers on the day of surgery and bring to  hospital day of surgery  _x__ Stop Metformin and Janumet 2 days prior to surgery.    ____ Take 1/2 of usual insulin dose the night before surgery and none on the morning     surgery.   _x___ Follow recommendations from Cardiologist, Pulmonologist or PCP regarding          stopping Aspirin, Coumadin, Plavix ,Eliquis, Effient, or Pradaxa, and Pletal.  X____Stop Anti-inflammatories such as Advil, Aleve, Ibuprofen, Motrin, Naproxen, Naprosyn, Goodies powders or aspirin products. OK to take Tylenol and                          Celebrex.   _x___ Stop supplements until after surgery.  But may continue Vitamin D, Vitamin B,       and multivitamin.  Stop fish oil today.   ____ Bring C-Pap to the hospital.

## 2018-02-17 NOTE — Care Management (Signed)
EKG reviewed and ok to proceed.

## 2018-02-22 MED ORDER — CEFAZOLIN SODIUM-DEXTROSE 2-4 GM/100ML-% IV SOLN
2.0000 g | Freq: Once | INTRAVENOUS | Status: AC
  Administered 2018-02-23: 2 g via INTRAVENOUS
  Filled 2018-02-22: qty 100

## 2018-02-23 ENCOUNTER — Ambulatory Visit: Payer: Medicare Other | Admitting: Anesthesiology

## 2018-02-23 ENCOUNTER — Other Ambulatory Visit: Payer: Self-pay

## 2018-02-23 ENCOUNTER — Ambulatory Visit
Admission: RE | Admit: 2018-02-23 | Discharge: 2018-02-23 | Disposition: A | Discharge status: Home / Self Care | Payer: Medicare Other | Source: Ambulatory Visit | Attending: Surgery | Admitting: Surgery

## 2018-02-23 ENCOUNTER — Encounter
Admission: RE | Disposition: A | Discharge status: Home / Self Care | Payer: Self-pay | Source: Ambulatory Visit | Attending: Surgery

## 2018-02-23 ENCOUNTER — Encounter: Payer: Self-pay | Admitting: *Deleted

## 2018-02-23 DIAGNOSIS — M65811 Other synovitis and tenosynovitis, right shoulder: Secondary | ICD-10-CM | POA: Diagnosis not present

## 2018-02-23 DIAGNOSIS — K219 Gastro-esophageal reflux disease without esophagitis: Secondary | ICD-10-CM | POA: Insufficient documentation

## 2018-02-23 DIAGNOSIS — M75121 Complete rotator cuff tear or rupture of right shoulder, not specified as traumatic: Secondary | ICD-10-CM | POA: Diagnosis present

## 2018-02-23 DIAGNOSIS — Z79899 Other long term (current) drug therapy: Secondary | ICD-10-CM | POA: Diagnosis not present

## 2018-02-23 DIAGNOSIS — E119 Type 2 diabetes mellitus without complications: Secondary | ICD-10-CM | POA: Insufficient documentation

## 2018-02-23 DIAGNOSIS — E785 Hyperlipidemia, unspecified: Secondary | ICD-10-CM | POA: Insufficient documentation

## 2018-02-23 DIAGNOSIS — M7521 Bicipital tendinitis, right shoulder: Secondary | ICD-10-CM | POA: Diagnosis not present

## 2018-02-23 DIAGNOSIS — Z7984 Long term (current) use of oral hypoglycemic drugs: Secondary | ICD-10-CM | POA: Diagnosis not present

## 2018-02-23 DIAGNOSIS — Z8546 Personal history of malignant neoplasm of prostate: Secondary | ICD-10-CM | POA: Insufficient documentation

## 2018-02-23 DIAGNOSIS — I10 Essential (primary) hypertension: Secondary | ICD-10-CM | POA: Diagnosis not present

## 2018-02-23 DIAGNOSIS — M94211 Chondromalacia, right shoulder: Secondary | ICD-10-CM | POA: Insufficient documentation

## 2018-02-23 HISTORY — PX: SHOULDER ARTHROSCOPY WITH OPEN ROTATOR CUFF REPAIR: SHX6092

## 2018-02-23 LAB — GLUCOSE, CAPILLARY
GLUCOSE-CAPILLARY: 171 mg/dL — AB (ref 65–99)
Glucose-Capillary: 153 mg/dL — ABNORMAL HIGH (ref 65–99)

## 2018-02-23 SURGERY — ARTHROSCOPY, SHOULDER WITH REPAIR, ROTATOR CUFF, OPEN
Anesthesia: General | Site: Shoulder | Laterality: Right | Wound class: Clean

## 2018-02-23 MED ORDER — ONDANSETRON HCL 4 MG/2ML IJ SOLN
INTRAMUSCULAR | Status: AC
  Filled 2018-02-23: qty 2

## 2018-02-23 MED ORDER — SUGAMMADEX SODIUM 200 MG/2ML IV SOLN
INTRAVENOUS | Status: AC
Start: 2018-02-23 — End: 2018-02-23
  Filled 2018-02-23: qty 2

## 2018-02-23 MED ORDER — BUPIVACAINE-EPINEPHRINE (PF) 0.5% -1:200000 IJ SOLN
INTRAMUSCULAR | Status: AC
  Filled 2018-02-23: qty 30

## 2018-02-23 MED ORDER — PROPOFOL 10 MG/ML IV BOLUS
INTRAVENOUS | Status: AC
  Filled 2018-02-23: qty 20

## 2018-02-23 MED ORDER — LACTATED RINGERS IV SOLN
INTRAVENOUS | Status: DC | PRN
  Administered 2018-02-23: 2000 mL

## 2018-02-23 MED ORDER — FENTANYL CITRATE (PF) 100 MCG/2ML IJ SOLN
INTRAMUSCULAR | Status: AC
  Filled 2018-02-23: qty 2

## 2018-02-23 MED ORDER — BUPIVACAINE LIPOSOME 1.3 % IJ SUSP
INTRAMUSCULAR | Status: DC | PRN
  Administered 2018-02-23: 20 mL via PERINEURAL

## 2018-02-23 MED ORDER — ONDANSETRON HCL 4 MG/2ML IJ SOLN: 4.0000 mg | Freq: Once | INTRAMUSCULAR | Status: DC | PRN

## 2018-02-23 MED ORDER — PHENYLEPHRINE HCL 10 MG/ML IJ SOLN
INTRAMUSCULAR | Status: DC | PRN
  Administered 2018-02-23 (×3): 100 ug via INTRAVENOUS

## 2018-02-23 MED ORDER — LIDOCAINE HCL (PF) 1 % IJ SOLN
INTRAMUSCULAR | Status: AC
  Filled 2018-02-23: qty 5

## 2018-02-23 MED ORDER — ROCURONIUM BROMIDE 100 MG/10ML IV SOLN
INTRAVENOUS | Status: DC | PRN
  Administered 2018-02-23: 10 mg via INTRAVENOUS
  Administered 2018-02-23: 40 mg via INTRAVENOUS
  Administered 2018-02-23: 10 mg via INTRAVENOUS

## 2018-02-23 MED ORDER — BUPIVACAINE LIPOSOME 1.3 % IJ SUSP
INTRAMUSCULAR | Status: AC
  Filled 2018-02-23: qty 20

## 2018-02-23 MED ORDER — FENTANYL CITRATE (PF) 100 MCG/2ML IJ SOLN: 25.0000 ug | INTRAMUSCULAR | Status: DC | PRN

## 2018-02-23 MED ORDER — LABETALOL HCL 5 MG/ML IV SOLN
5.0000 mg | INTRAVENOUS | Status: DC | PRN
  Administered 2018-02-23 (×2): 5 mg via INTRAVENOUS

## 2018-02-23 MED ORDER — LIDOCAINE HCL (PF) 1 % IJ SOLN
INTRAMUSCULAR | Status: DC | PRN
  Administered 2018-02-23: 5 mL via SUBCUTANEOUS

## 2018-02-23 MED ORDER — SUGAMMADEX SODIUM 500 MG/5ML IV SOLN
INTRAVENOUS | Status: DC | PRN
  Administered 2018-02-23: 200 mg via INTRAVENOUS

## 2018-02-23 MED ORDER — EPINEPHRINE PF 1 MG/ML IJ SOLN
INTRAMUSCULAR | Status: AC
Start: 2018-02-23 — End: 2018-02-23
  Filled 2018-02-23: qty 2

## 2018-02-23 MED ORDER — DEXAMETHASONE SODIUM PHOSPHATE 10 MG/ML IJ SOLN
INTRAMUSCULAR | Status: DC | PRN
  Administered 2018-02-23: 5 mg via INTRAVENOUS

## 2018-02-23 MED ORDER — SEVOFLURANE IN SOLN
RESPIRATORY_TRACT | Status: AC
Start: 2018-02-23 — End: 2018-02-23
  Filled 2018-02-23: qty 250

## 2018-02-23 MED ORDER — ONDANSETRON HCL 4 MG/2ML IJ SOLN
INTRAMUSCULAR | Status: DC | PRN
  Administered 2018-02-23: 4 mg via INTRAVENOUS

## 2018-02-23 MED ORDER — SODIUM CHLORIDE 0.9 % IV SOLN
INTRAVENOUS | Status: DC
  Administered 2018-02-23 (×2): via INTRAVENOUS

## 2018-02-23 MED ORDER — FENTANYL CITRATE (PF) 100 MCG/2ML IJ SOLN
50.0000 ug | Freq: Once | INTRAMUSCULAR | Status: AC
  Administered 2018-02-23: 50 ug via INTRAVENOUS

## 2018-02-23 MED ORDER — ROCURONIUM BROMIDE 50 MG/5ML IV SOLN
INTRAVENOUS | Status: AC
  Filled 2018-02-23: qty 1

## 2018-02-23 MED ORDER — PROPOFOL 10 MG/ML IV BOLUS
INTRAVENOUS | Status: DC | PRN
  Administered 2018-02-23: 150 mg via INTRAVENOUS

## 2018-02-23 MED ORDER — SODIUM CHLORIDE 0.9 % IV SOLN
INTRAVENOUS | Status: DC | PRN
  Administered 2018-02-23: 10 ug/min via INTRAVENOUS

## 2018-02-23 MED ORDER — MIDAZOLAM HCL 2 MG/2ML IJ SOLN
1.0000 mg | Freq: Once | INTRAMUSCULAR | Status: AC
  Administered 2018-02-23: 1 mg via INTRAVENOUS

## 2018-02-23 MED ORDER — MIDAZOLAM HCL 2 MG/2ML IJ SOLN
INTRAMUSCULAR | Status: AC
  Administered 2018-02-23: 1 mg via INTRAVENOUS
  Filled 2018-02-23: qty 2

## 2018-02-23 MED ORDER — CEFAZOLIN SODIUM-DEXTROSE 2-3 GM-%(50ML) IV SOLR
INTRAVENOUS | Status: AC
  Filled 2018-02-23: qty 50

## 2018-02-23 MED ORDER — FENTANYL CITRATE (PF) 100 MCG/2ML IJ SOLN
INTRAMUSCULAR | Status: AC
  Administered 2018-02-23: 50 ug via INTRAVENOUS
  Filled 2018-02-23: qty 2

## 2018-02-23 MED ORDER — BUPIVACAINE HCL (PF) 0.5 % IJ SOLN
INTRAMUSCULAR | Status: DC | PRN
  Administered 2018-02-23: 10 mL via PERINEURAL

## 2018-02-23 MED ORDER — OXYCODONE HCL 5 MG PO TABS: 5.0000 mg | ORAL_TABLET | ORAL | 0 refills | Status: DC | PRN

## 2018-02-23 MED ORDER — FENTANYL CITRATE (PF) 100 MCG/2ML IJ SOLN
INTRAMUSCULAR | Status: DC | PRN
  Administered 2018-02-23 (×2): 25 ug via INTRAVENOUS
  Administered 2018-02-23: 100 ug via INTRAVENOUS
  Administered 2018-02-23 (×2): 25 ug via INTRAVENOUS

## 2018-02-23 MED ORDER — LABETALOL HCL 5 MG/ML IV SOLN
INTRAVENOUS | Status: DC | PRN
  Administered 2018-02-23 (×2): 5 mg via INTRAVENOUS

## 2018-02-23 MED ORDER — BUPIVACAINE HCL (PF) 0.5 % IJ SOLN
INTRAMUSCULAR | Status: AC
  Filled 2018-02-23: qty 10

## 2018-02-23 MED ORDER — SUGAMMADEX SODIUM 200 MG/2ML IV SOLN
INTRAVENOUS | Status: AC
  Filled 2018-02-23: qty 2

## 2018-02-23 SURGICAL SUPPLY — 43 items
ANCHOR JUGGERKNOT WTAP NDL 2.9 (Anchor) ×15 IMPLANT
ANCHOR SUT 1.45 SZ 1 SHORT (Anchor) IMPLANT
ANCHOR SUT QUATTRO KNTLS 4.5 (Anchor) ×6 IMPLANT
BIT DRILL JUGRKNT W/NDL BIT2.9 (DRILL) ×1 IMPLANT
BLADE FULL RADIUS 3.5 (BLADE) ×3 IMPLANT
BUR ACROMIONIZER 4.0 (BURR) ×3 IMPLANT
CANNULA SHAVER 8MMX76MM (CANNULA) ×3 IMPLANT
CHLORAPREP W/TINT 26ML (MISCELLANEOUS) ×3 IMPLANT
COVER MAYO STAND STRL (DRAPES) ×3 IMPLANT
DRAPE IMP U-DRAPE 54X76 (DRAPES) ×6 IMPLANT
DRILL JUGGERKNOT W/NDL BIT 2.9 (DRILL) ×3
ELECT REM PT RETURN 9FT ADLT (ELECTROSURGICAL) ×3
ELECTRODE REM PT RTRN 9FT ADLT (ELECTROSURGICAL) ×1 IMPLANT
GAUZE PETRO XEROFOAM 1X8 (MISCELLANEOUS) ×3 IMPLANT
GAUZE SPONGE 4X4 12PLY STRL (GAUZE/BANDAGES/DRESSINGS) ×3 IMPLANT
GLOVE BIO SURGEON STRL SZ7.5 (GLOVE) ×6 IMPLANT
GLOVE BIO SURGEON STRL SZ8 (GLOVE) ×6 IMPLANT
GLOVE BIOGEL PI IND STRL 8 (GLOVE) ×1 IMPLANT
GLOVE BIOGEL PI INDICATOR 8 (GLOVE) ×2
GLOVE INDICATOR 8.0 STRL GRN (GLOVE) ×3 IMPLANT
GOWN STRL REUS W/ TWL LRG LVL3 (GOWN DISPOSABLE) ×1 IMPLANT
GOWN STRL REUS W/ TWL XL LVL3 (GOWN DISPOSABLE) ×1 IMPLANT
GOWN STRL REUS W/TWL LRG LVL3 (GOWN DISPOSABLE) ×2
GOWN STRL REUS W/TWL XL LVL3 (GOWN DISPOSABLE) ×2
GRASPER SUT 15 45D LOW PRO (SUTURE) IMPLANT
IV LACTATED RINGER IRRG 3000ML (IV SOLUTION) ×4
IV LR IRRIG 3000ML ARTHROMATIC (IV SOLUTION) ×2 IMPLANT
MANIFOLD NEPTUNE II (INSTRUMENTS) ×3 IMPLANT
MASK FACE SPIDER DISP (MASK) ×3 IMPLANT
MAT BLUE FLOOR 46X72 FLO (MISCELLANEOUS) ×3 IMPLANT
PACK ARTHROSCOPY SHOULDER (MISCELLANEOUS) ×3 IMPLANT
SLING ARM LRG DEEP (SOFTGOODS) ×3 IMPLANT
SLING ULTRA II LG (MISCELLANEOUS) ×3 IMPLANT
STAPLER SKIN PROX 35W (STAPLE) ×3 IMPLANT
STRAP SAFETY 5IN WIDE (MISCELLANEOUS) ×3 IMPLANT
SUT ETHIBOND 0 MO6 C/R (SUTURE) ×6 IMPLANT
SUT VIC AB 2-0 CT1 27 (SUTURE) ×4
SUT VIC AB 2-0 CT1 TAPERPNT 27 (SUTURE) ×2 IMPLANT
TAPE MICROFOAM 4IN (TAPE) ×3 IMPLANT
TUBING ARTHRO INFLOW-ONLY STRL (TUBING) ×3 IMPLANT
TUBING CONNECTING 10 (TUBING) ×2 IMPLANT
TUBING CONNECTING 10' (TUBING) ×1
WAND HAND CNTRL MULTIVAC 90 (MISCELLANEOUS) ×3 IMPLANT

## 2018-02-23 NOTE — Anesthesia Preprocedure Evaluation (Signed)
Anesthesia Evaluation  Patient identified by MRN, date of birth, ID band Patient awake    Reviewed: Allergy & Precautions, H&P , NPO status , Patient's Chart, lab work & pertinent test results, reviewed documented beta blocker date and time   History of Anesthesia Complications (+) PROLONGED EMERGENCE and history of anesthetic complications  Airway Mallampati: II  TM Distance: >3 FB Neck ROM: full    Dental  (+) Caps, Dental Advidsory Given, Teeth Intact, Missing   Pulmonary neg pulmonary ROS,           Cardiovascular Exercise Tolerance: Good hypertension, (-) angina(-) CAD, (-) Past MI, (-) Cardiac Stents and (-) CABG (-) dysrhythmias (-) Valvular Problems/Murmurs     Neuro/Psych negative neurological ROS  negative psych ROS   GI/Hepatic Neg liver ROS, GERD  ,  Endo/Other  diabetes, Type 2, Oral Hypoglycemic Agents  Renal/GU negative Renal ROS  negative genitourinary   Musculoskeletal   Abdominal   Peds  Hematology negative hematology ROS (+)   Anesthesia Other Findings Past Medical History: No date: Cancer ([place])     Comment:  PROSTATE No date: Diabetes mellitus without complication (HCC) No date: Fatigue     Comment:  POSSIBLE SEIZURE  NO EVAL  NO FUTHER PROBLEM No date: GERD (gastroesophageal reflux disease)     Comment:  ULCERS No date: Hypertension   Reproductive/Obstetrics negative OB ROS                             Anesthesia Physical Anesthesia Plan  ASA: II  Anesthesia Plan: General   Post-op Pain Management:  Regional for Post-op pain   Induction: Intravenous  PONV Risk Score and Plan: 2 and Ondansetron and Dexamethasone  Airway Management Planned: Oral ETT  Additional Equipment:   Intra-op Plan:   Post-operative Plan: Extubation in OR  Informed Consent: I have reviewed the patients History and Physical, chart, labs and discussed the procedure including the  risks, benefits and alternatives for the proposed anesthesia with the patient or authorized representative who has indicated his/her understanding and acceptance.   Dental Advisory Given  Plan Discussed with: Anesthesiologist, CRNA and Surgeon  Anesthesia Plan Comments:         Anesthesia Quick Evaluation

## 2018-02-23 NOTE — Discharge Instructions (Addendum)
Interscalene Nerve Block with Exparel  1.  For your surgery you have received an Interscalene Nerve Block with Exparel. 2. Nerve Blocks affect many types of nerves, including nerves that control movement, pain and normal sensation.  You may experience feelings such as numbness, tingling, heaviness, weakness or the inability to move your arm or the feeling or sensation that your arm has "fallen asleep". 3. A nerve block with Exparel can last up to 5 days.  Usually the weakness wears off first.  The tingling and heaviness usually wear off next.  Finally you may start to notice pain.  Keep in mind that this may occur in any order.  Once a nerve block starts to wear off it is usually completely gone within 60 minutes. 4. ISNB may cause mild shortness of breath, a hoarse voice, blurry vision, unequal pupils, or drooping of the face on the same side as the nerve block.  These symptoms will usually resolve with the numbness.  Very rarely the procedure itself can cause mild seizures. 5. If needed, your surgeon will give you a prescription for pain medication.  It will take about 60 minutes for the oral pain medication to become fully effective.  So, it is recommended that you start taking this medication before the nerve block first begins to wear off, or when you first begin to feel discomfort. 6. Take your pain medication only as prescribed.  Pain medication can cause sedation and decrease your breathing if you take more than you need for the level of pain that you have. 7. Nausea is a common side effect of many pain medications.  You may want to eat something before taking your pain medicine to prevent nausea. 8. After an Interscalene nerve block, you cannot feel pain, pressure or extremes in temperature in the effected arm.  Because your arm is numb it is at an increased risk for injury.  To decrease the possibility of injury, please practice the following:  a. While you are awake change the position of  your arm frequently to prevent too much pressure on any one area for prolonged periods of time. b.  If you have a cast or tight dressing, check the color or your fingers every couple of hours.  Call your surgeon with the appearance of any discoloration (white or blue). c. If you are given a sling to wear before you go home, please wear it  at all times until the block has completely worn off.  Do not get up at night without your sling. d. Please contact [facility] or your surgeon if you do not begin to regain sensation after 7 days from the surgery.  Anesthesia may be contacted by calling the Same Day Surgery Department, Mon. through Fri., 6 am to 4 pm at [phone].   e. If you experience any other problems or concerns, please contact your surgeon's office. If you experience severe or prolonged shortness of breath go to the nearest emergency department.    Orthopedic discharge instructions: Keep dressing dry and intact.  May shower after dressing changed on post-op day #4 (Saturday).  Cover staples with Band-Aids after drying off. Apply ice frequently to shoulder. Take ibuprofen 800 mg TID with meals for 7-10 days, then as necessary. Take oxycodone as prescribed when needed.  May supplement with ES Tylenol if necessary. Keep shoulder immobilizer on at all times except may remove for bathing purposes. Follow-up in 10-14 days or as scheduled.

## 2018-02-23 NOTE — Anesthesia Post-op Follow-up Note (Signed)
Anesthesia QCDR form completed.        

## 2018-02-23 NOTE — H&P (Signed)
Paper H&P to be scanned into permanent record. H&P reviewed and patient re-examined. No changes. 

## 2018-02-23 NOTE — Anesthesia Procedure Notes (Signed)
Anesthesia Regional Block: Interscalene brachial plexus block   Pre-Anesthetic Checklist: ,, timeout performed, Correct Patient, Correct Site, Correct Laterality, Correct Procedure, Correct Position, site marked, Risks and benefits discussed,  Surgical consent,  Pre-op evaluation,  At surgeon's request and post-op pain management  Laterality: Left and Upper  Prep: alcohol swabs       Needles:  Injection technique: Single-shot  Needle Type: Stimiplex     Needle Length: 5cm  Needle Gauge: 22     Additional Needles:   Procedures:,,,, ultrasound used (permanent image in chart),,,,  Narrative:  Start time: 02/23/2018 2:12 PM End time: 02/23/2018 2:18 PM Injection made incrementally with aspirations every 5 mL.  Performed by: Personally  Anesthesiologist: Martha Clan, MD  Additional Notes: Functioning IV was confirmed and monitors were applied.  A 72mm 22ga Stimuplex needle was used. Sterile prep and drape,hand hygiene and sterile gloves were used.  Negative aspiration and negative test dose prior to incremental administration of local anesthetic. The patient tolerated the procedure well.

## 2018-02-23 NOTE — Op Note (Signed)
02/23/2018  5:21 PM  Patient:   Jesse Curtis  Pre-Op Diagnosis:    Massive rotator cuff tear with biceps tendinopathy, right shoulder.  Post-Op Diagnosis: Same.  Procedure: Limited arthroscopic debridement, arthroscopic subacromial decompression, mini-open rotator cuff repair, and mini-open biceps tenodesis, right shoulder.  Anesthesia: General endotracheal with an Exparel interscalene block placed preoperatively by the anesthesiologist.  Surgeon:   Pascal Lux, MD  Assistant:   Cameron Proud, PA-C  Findings: As above. There were grade 2 chondromalacial changes involving the central portion of the glenoid as well as grade 2 chondral malacia changes involving the anterior portion of the humeral head. The rotator cuff tear involved the superior insertional fibers of the subscapularis tendon, the entire supraspinatus tendon, and the entire infraspinatus tendon. There was moderate labral fraying anteriorly and superiorly without frank detachment of the labrum from the glenoid. The biceps tendon had subluxed medially and exhibited moderate tendinopathic changes with small partial tears.  Complications: None  Fluids:   1000 cc  Estimated blood loss: 15 cc  Tourniquet time: None  Drains: None  Closure: Staples   Brief clinical note: The patient is a 72 year old male with a history of right shoulder pain. The patient's symptoms have progressed despite medications, activity modification, etc. The patient's history and examination are consistent with impingement/tendinopathy with a rotator cuff tear. These findings were confirmed by MRI scan. The patient presents at this time for definitive management of these shoulder symptoms.  Procedure: The patient underwent placement of an interscalene block using Exparel by the anesthesiologist in the preoperative holding area before being brought into the operating room and lain in the supine position. The patient then  underwent general endotracheal intubation and anesthesia before being repositioned in the beach chair position using the beach chair positioner. The right shoulder and upper extremity were prepped with ChloraPrep solution before being draped sterilely. Preoperative antibiotics were administered. A timeout was performed to confirm the proper surgical site before the expected portal sites and incision site were injected with 0.5% Sensorcaine with epinephrine. A posterior portal was created and the glenohumeral joint thoroughly inspected with the findings as described above. An anterior portal was created using an outside-in technique. The labrum and rotator cuff were further probed, again confirming the above-noted findings. The areas of labral fraying were debrided back to stable margins using the full-radius resector. The margins of the rotator cuff tear also were debrided back to stable margins using the full-radius resector. Finally, areas of substantial synovitis were debrided back to stable margins using the full-radius resector. The ArthroCare wand was inserted and used to release the biceps tendon from its labral attachment, as well as to obtain hemostasis and to "anneal" the labrum superiorly and anteriorly. The instruments were removed from the joint after suctioning the excess fluid.  The camera was repositioned through the posterior portal into the subacromial space. A separate lateral portal was created using an outside-in technique. The 3.5 mm full-radius resector was introduced and used to perform a subtotal bursectomy. The ArthroCare wand was then inserted and used to remove the periosteal tissue off the undersurface of the anterior third of the acromion as well as to recess the coracoacromial ligament from its attachment along the anterior and lateral margins of the acromion. The 4.0 mm acromionizing bur was introduced and used to complete the decompression by removing the undersurface of the  anterior third of the acromion. The full radius resector was reintroduced to remove any residual bony debris before the ArthroCare  wand was reintroduced to obtain hemostasis. The instruments were then removed from the subacromial space after suctioning the excess fluid.  An approximately 4-5 cm incision was made over the anterolateral aspect of the shoulder beginning at the anterolateral corner of the acromion and extending distally in line with the bicipital groove. This incision was carried down through the subcutaneous tissues to expose the deltoid fascia. The raphae between the anterior and middle thirds was identified and this plane developed to provide access into the subacromial space. Additional bursal tissues were debrided sharply using Metzenbaum scissors. The rotator cuff tear was readily identified. The margins were debrided sharply with a #15 blade and the exposed greater and lesser tuberosities roughened with a rongeur. The tear was repaired using four Biomet 2.9 mm JuggerKnot anchors. These sutures were then brought back laterally and secured using two [place] QuatroLink anchors to create a two-layer closure. An apparent watertight closure was obtained.  The bicipital groove was identified by palpation and opened for 1-1.5 cm. The biceps tendon stump was retrieved through this defect. The floor of the bicipital groove was roughened with a curet before another Biomet 2.9 mm JuggerKnot anchor was inserted. Both sets of sutures were passed through the biceps tendon and tied securely to effect the tenodesis. The bicipital sheath was reapproximated using two #0 Ethibond interrupted sutures, incorporating the biceps tendon to further reinforce the tenodesis.  The wound was copiously irrigated with sterile saline solution before the deltoid raphae was reapproximated using 2-0 Vicryl interrupted sutures. The subcutaneous tissues were closed in two layers using 2-0 Vicryl interrupted sutures before the  skin was closed using staples. The portal sites also were closed using staples. A sterile bulky dressing was applied to the shoulder before the arm was placed into a shoulder immobilizer. The patient was then awakened, extubated, and returned to the recovery room in satisfactory condition after tolerating the procedure well.

## 2018-02-23 NOTE — Transfer of Care (Signed)
Immediate Anesthesia Transfer of Care Note  Patient: Jesse Curtis  Procedure(s) Performed: SHOULDER ARTHROSCOPY WITH OPEN ROTATOR CUFF REPAIR (Right Shoulder)  Patient Location: PACU  Anesthesia Type:General  Level of Consciousness: awake  Airway & Oxygen Therapy: Patient Spontanous Breathing  Post-op Assessment: Report given to RN  Post vital signs: stable; Patient BP elevated (diastolic) Labetalol given and BP reassessed prior to leaving recovery. Patient c/o no pain.   Last Vitals:  Vitals Value Taken Time  BP 135/91 02/23/2018  5:45 PM  Temp 36.2 C 02/23/2018  5:49 PM  Pulse 103 02/23/2018  5:50 PM  Resp 24 02/23/2018  5:50 PM  SpO2 89 % 02/23/2018  5:50 PM  Vitals shown include unvalidated device data.  Last Pain:  Vitals:   02/23/18 1749  TempSrc: Tympanic  PainSc:       Patients Stated Pain Goal: 0 (14/43/15 4008)  Complications: No apparent anesthesia complications

## 2018-02-23 NOTE — Anesthesia Procedure Notes (Signed)
Procedure Name: Intubation Date/Time: 02/23/2018 3:17 PM Performed by: Jonna Clark, CRNA Pre-anesthesia Checklist: Patient identified, Patient being monitored, Timeout performed, Emergency Drugs available and Suction available Patient Re-evaluated:Patient Re-evaluated prior to induction Oxygen Delivery Method: Circle system utilized Preoxygenation: Pre-oxygenation with 100% oxygen Induction Type: IV induction Ventilation: Mask ventilation without difficulty Laryngoscope Size: 3 and McGraph Grade View: Grade II Tube type: Oral Tube size: 7.5 mm Number of attempts: 2 Airway Equipment and Method: Stylet Placement Confirmation: ETT inserted through vocal cords under direct vision,  positive ETCO2 and breath sounds checked- equal and bilateral Secured at: 21 cm Tube secured with: Tape Dental Injury: Teeth and Oropharynx as per pre-operative assessment  Difficulty Due To: Difficult Airway- due to anterior larynx and Difficulty was anticipated Future Recommendations: Recommend- induction with short-acting agent, and alternative techniques readily available

## 2018-02-24 ENCOUNTER — Encounter: Payer: Self-pay | Admitting: Surgery

## 2018-02-24 NOTE — Anesthesia Postprocedure Evaluation (Signed)
Anesthesia Post Note  Patient: Jesse Curtis  Procedure(s) Performed: SHOULDER ARTHROSCOPY WITH OPEN ROTATOR CUFF REPAIR (Right Shoulder)  Patient location during evaluation: PACU Anesthesia Type: General Level of consciousness: awake and alert Pain management: pain level controlled Vital Signs Assessment: post-procedure vital signs reviewed and stable Respiratory status: spontaneous breathing, nonlabored ventilation, respiratory function stable and patient connected to nasal cannula oxygen Cardiovascular status: blood pressure returned to baseline and stable Postop Assessment: no apparent nausea or vomiting Anesthetic complications: no     Last Vitals:  Vitals:   02/23/18 1841 02/23/18 1900  BP:  (!) 137/95  Pulse:  98  Resp:  19  Temp:    SpO2: 93% 92%    Last Pain:  Vitals:   02/23/18 1900  TempSrc:   PainSc: [place]

## 2018-08-22 DIAGNOSIS — E538 Deficiency of other specified B group vitamins: Secondary | ICD-10-CM | POA: Insufficient documentation

## 2018-09-06 IMAGING — MR MR SHOULDER*R* W/O CM
5 series · 40 of 40 positions shown · non-contrast
Comparison: None.

CLINICAL DATA: The patient suffered a lifting injury in the right
shoulder nine months ago. The patient heard a pop in the shoulder
with onset of pain at the time of the injury. Continued pain and
limited range of motion.

EXAM:
MRI OF THE RIGHT SHOULDER WITHOUT CONTRAST
TECHNIQUE: Multiplanar, multisequence MR imaging of the shoulder was performed.
No intravenous contrast was administered.

[Series 3: T2 fat-sat · axial · 4.0mm · 0.55mm/px · z∈[-51,+55]mm · 8 of 25 slices shown (1 of 3)]
[im 1/25]
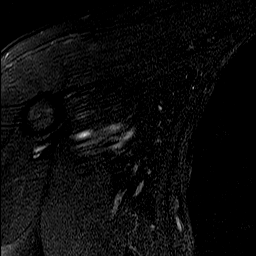
[im 4/25]
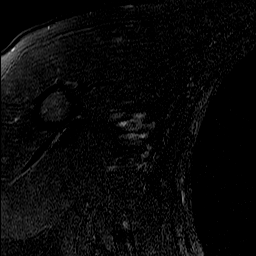
[im 7/25]
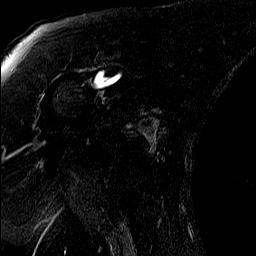
[im 11/25]
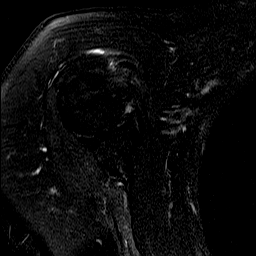
[im 14/25]
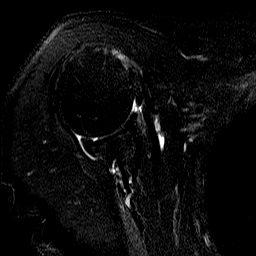
[im 18/25]
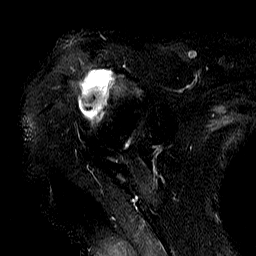
[im 21/25]
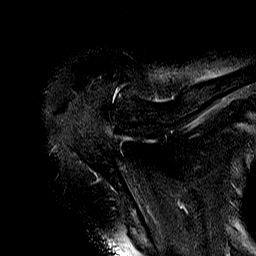
[im 25/25]
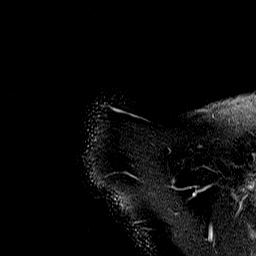

[Series 4: T2 fat-sat · sagittal · 4.0mm · 0.62mm/px · 8 of 23 slices shown (2 of 3)]
[im 1/23]
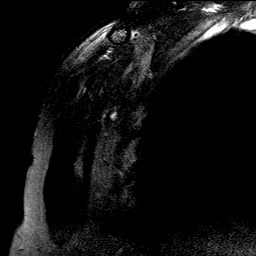
[im 4/23]
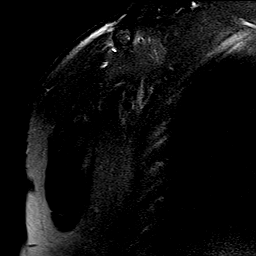
[im 7/23]
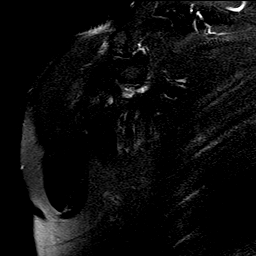
[im 10/23]
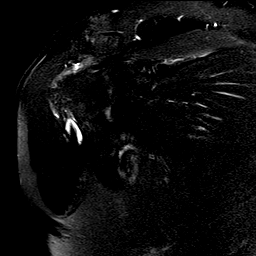
[im 13/23]
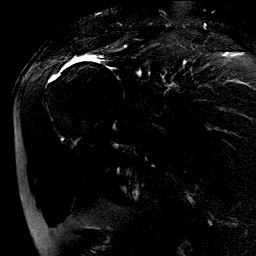
[im 16/23]
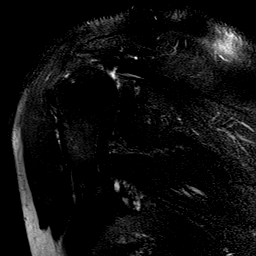
[im 19/23]
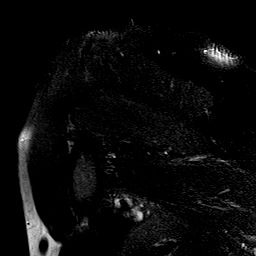
[im 23/23]
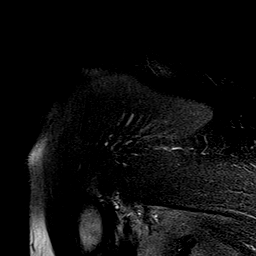

[Series 5: PD · sagittal · 4.0mm · 0.62mm/px · 8 of 23 slices shown]
[im 1/23]
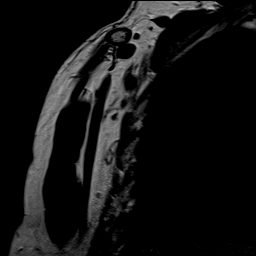
[im 4/23]
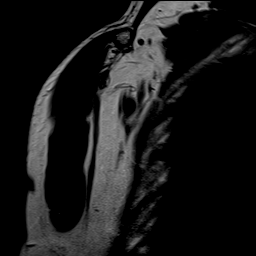
[im 7/23]
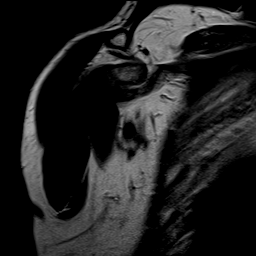
[im 10/23]
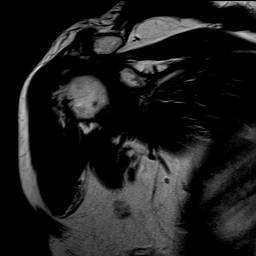
[im 13/23]
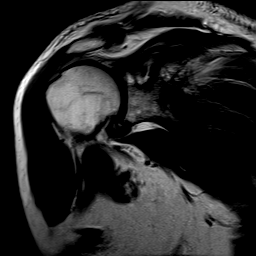
[im 16/23]
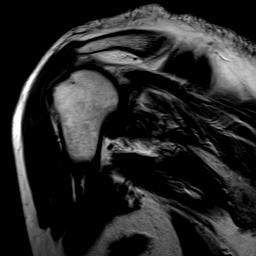
[im 19/23]
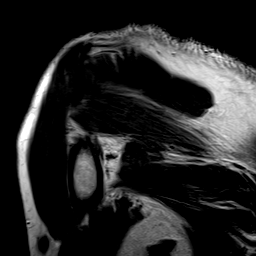
[im 23/23]
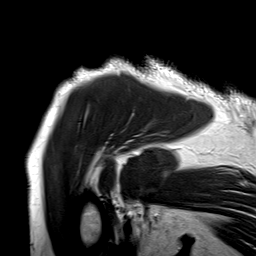

[Series 6: T1 · coronal · 4.0mm · 0.62mm/px · 8 of 23 slices shown]
[im 1/23]
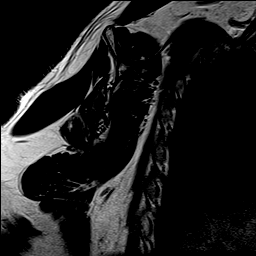
[im 4/23]
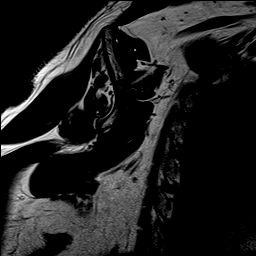
[im 7/23]
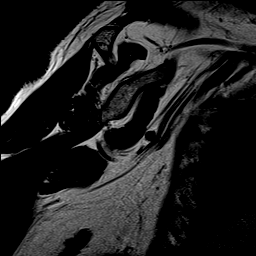
[im 10/23]
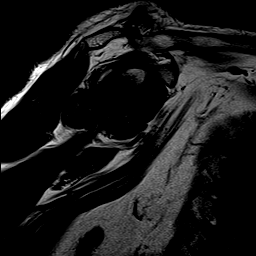
[im 13/23]
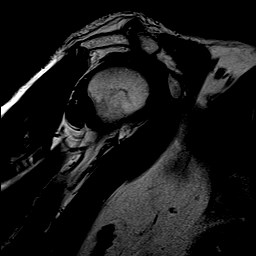
[im 16/23]
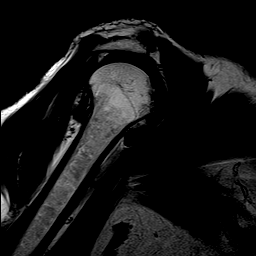
[im 19/23]
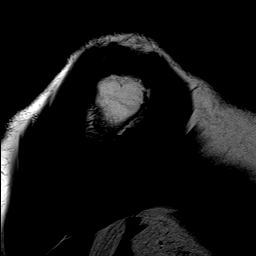
[im 23/23]
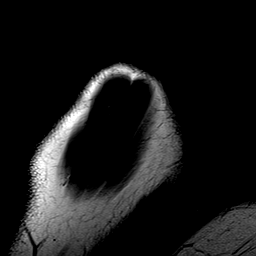

[Series 7: T2 fat-sat · coronal · 4.0mm · 0.62mm/px · 8 of 23 slices shown (3 of 3)]
[im 1/23]
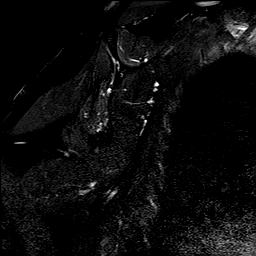
[im 4/23]
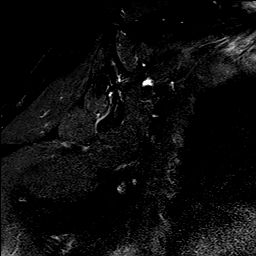
[im 7/23]
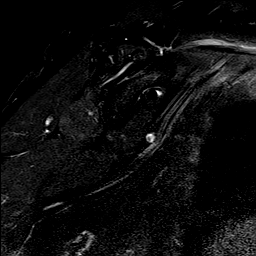
[im 10/23]
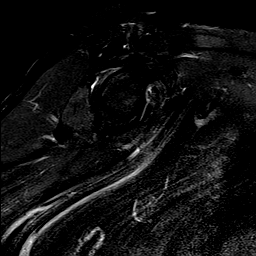
[im 13/23]
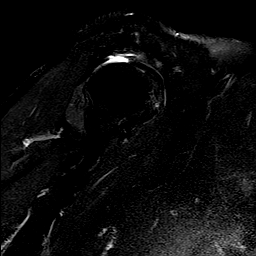
[im 16/23]
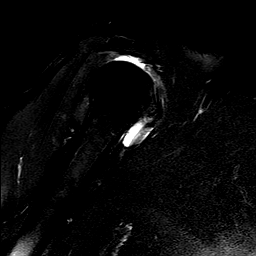
[im 19/23]
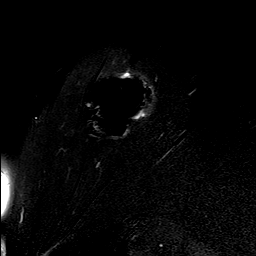
[im 23/23]
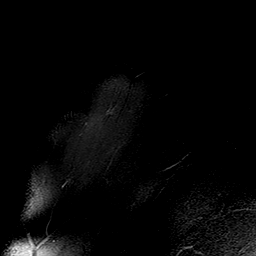

[40 of 40 positions shown; findings below may reference images not displayed]

FINDINGS: Rotator cuff: The patient has complete supraspinatus and
infraspinatus tendon tears. Both tendons are retracted to the level
of the glenoid, 3.5-4.5 cm. There is severe subscapularis
tendinopathy with a partial width tear of the superior fibers of the
tendon measuring approximately 0.5 cm craniocaudal. Retraction is to
the glenoid, 2.5-3 cm.

Muscles: There is mild infraspinatus atrophy. Musculature is
otherwise preserved.

Biceps long head: The tendon is intact but medially subluxed out of
the bicipital groove into the glenohumeral joint due to the
patient's subscapularis tear.

Acromioclavicular Joint: Bulky degenerative change is present. Type
2 acromion. Fluid is seen in the subacromial/subdeltoid bursa.

Glenohumeral Joint: Mild degenerative change is seen.

Labrum:  Intact.

Bones:  No fracture or worrisome lesion.

Other: None.
IMPRESSION: Complete supraspinatus and infraspinatus tendon tears with 3.5-4.5
cm of tendon retraction. There is mild atrophy of the infraspinatus.
Musculature is otherwise preserved.

Severe subscapularis tendinopathy. Partial tear of the superior
fibers of the tendon is identified with retraction of 2.5-3 cm. No
atrophy.

Intact biceps tendon is medially subluxed out of the bicipital
groove into the glenohumeral joint due to the patient's
subscapularis tear consistent with a biceps pulley injury.

Bulky acromioclavicular osteoarthritis

Subacromial/subdeltoid bursitis.

## 2018-10-20 ENCOUNTER — Other Ambulatory Visit: Payer: Self-pay | Admitting: Acute Care

## 2018-10-20 DIAGNOSIS — R42 Dizziness and giddiness: Secondary | ICD-10-CM

## 2018-11-04 ENCOUNTER — Ambulatory Visit: Payer: Medicare Other | Attending: Neurology

## 2018-11-04 DIAGNOSIS — G4733 Obstructive sleep apnea (adult) (pediatric): Secondary | ICD-10-CM | POA: Diagnosis not present

## 2018-11-04 DIAGNOSIS — R5383 Other fatigue: Secondary | ICD-10-CM | POA: Insufficient documentation

## 2018-11-10 ENCOUNTER — Ambulatory Visit: Admission: RE | Admit: 2018-11-10 | Payer: Medicare Other | Source: Ambulatory Visit

## 2018-11-10 ENCOUNTER — Other Ambulatory Visit: Payer: Medicare Other

## 2018-11-10 ENCOUNTER — Ambulatory Visit
Admission: RE | Admit: 2018-11-10 | Discharge: 2018-11-10 | Disposition: A | Discharge status: Home / Self Care | Payer: Medicare Other | Source: Ambulatory Visit | Attending: Acute Care | Admitting: Acute Care

## 2018-11-10 DIAGNOSIS — R42 Dizziness and giddiness: Secondary | ICD-10-CM | POA: Insufficient documentation

## 2018-12-09 ENCOUNTER — Ambulatory Visit: Payer: Medicare Other | Attending: Neurology

## 2018-12-10 ENCOUNTER — Ambulatory Visit: Payer: Medicare Other | Attending: Neurology

## 2018-12-10 DIAGNOSIS — G4733 Obstructive sleep apnea (adult) (pediatric): Secondary | ICD-10-CM | POA: Insufficient documentation

## 2018-12-10 DIAGNOSIS — G4761 Periodic limb movement disorder: Secondary | ICD-10-CM | POA: Insufficient documentation

## 2019-09-09 DIAGNOSIS — G4733 Obstructive sleep apnea (adult) (pediatric): Secondary | ICD-10-CM | POA: Insufficient documentation

## 2020-01-30 ENCOUNTER — Other Ambulatory Visit: Payer: Self-pay

## 2020-01-30 ENCOUNTER — Ambulatory Visit: Payer: Medicare Other | Attending: Internal Medicine

## 2020-01-30 DIAGNOSIS — Z23 Encounter for immunization: Secondary | ICD-10-CM

## 2020-01-30 NOTE — Progress Notes (Signed)
   Covid-19 Vaccination Clinic  Name:  LAIDEN KANIECKI    MRN: [id number] DOB: September 20, 1946  01/30/2020  Mr. Tramell was observed post Covid-19 immunization for 15 minutes without incident. He was provided with Vaccine Information Sheet and instruction to access the V-Safe system.   Mr. Sabia was instructed to call 911 with any severe reactions post vaccine: Marland Kitchen Difficulty breathing  . Swelling of face and throat  . A fast heartbeat  . A bad rash all over body  . Dizziness and weakness   Immunizations Administered    Name Date Dose VIS Date Route   Pfizer COVID-19 Vaccine 01/30/2020 10:20 AM 0.3 mL 10/14/2019 Intramuscular   Manufacturer: [employer]   Lot: [id number]   [place]: [id number]

## 2020-02-22 ENCOUNTER — Ambulatory Visit: Payer: Medicare Other | Attending: Internal Medicine

## 2020-02-22 DIAGNOSIS — Z23 Encounter for immunization: Secondary | ICD-10-CM

## 2020-02-22 NOTE — Progress Notes (Signed)
   Covid-19 Vaccination Clinic  Name:  Jesse Curtis    MRN: [id number] DOB: May 21, 1946  02/22/2020  [name] was observed post Covid-19 immunization for 15 minutes without incident. He was provided with Vaccine Information Sheet and instruction to access the V-Safe system.   [name] was instructed to call 911 with any severe reactions post vaccine: Marland Kitchen Difficulty breathing  . Swelling of face and throat  . A fast heartbeat  . A bad rash all over body  . Dizziness and weakness   Immunizations Administered    Name Date Dose VIS Date Route   Pfizer COVID-19 Vaccine 02/22/2020 12:06 PM 0.3 mL 12/28/2018 Intramuscular   Manufacturer: [place]   Lot: [id number]   [place]: [id number]

## 2020-03-20 DIAGNOSIS — R262 Difficulty in walking, not elsewhere classified: Secondary | ICD-10-CM | POA: Diagnosis not present

## 2020-03-20 DIAGNOSIS — R5383 Other fatigue: Secondary | ICD-10-CM | POA: Diagnosis not present

## 2020-03-20 DIAGNOSIS — G479 Sleep disorder, unspecified: Secondary | ICD-10-CM | POA: Diagnosis not present

## 2020-03-20 DIAGNOSIS — R42 Dizziness and giddiness: Secondary | ICD-10-CM | POA: Diagnosis not present

## 2020-04-03 DIAGNOSIS — G4733 Obstructive sleep apnea (adult) (pediatric): Secondary | ICD-10-CM | POA: Diagnosis not present

## 2020-04-03 DIAGNOSIS — I10 Essential (primary) hypertension: Secondary | ICD-10-CM | POA: Diagnosis not present

## 2020-05-08 DIAGNOSIS — E213 Hyperparathyroidism, unspecified: Secondary | ICD-10-CM | POA: Diagnosis not present

## 2020-05-08 DIAGNOSIS — G4733 Obstructive sleep apnea (adult) (pediatric): Secondary | ICD-10-CM | POA: Diagnosis not present

## 2020-05-08 DIAGNOSIS — E7800 Pure hypercholesterolemia, unspecified: Secondary | ICD-10-CM | POA: Diagnosis not present

## 2020-05-08 DIAGNOSIS — E1165 Type 2 diabetes mellitus with hyperglycemia: Secondary | ICD-10-CM | POA: Diagnosis not present

## 2020-05-08 DIAGNOSIS — N401 Enlarged prostate with lower urinary tract symptoms: Secondary | ICD-10-CM | POA: Diagnosis not present

## 2020-05-08 DIAGNOSIS — D649 Anemia, unspecified: Secondary | ICD-10-CM | POA: Diagnosis not present

## 2020-05-08 DIAGNOSIS — Z9989 Dependence on other enabling machines and devices: Secondary | ICD-10-CM | POA: Diagnosis not present

## 2020-05-15 DIAGNOSIS — Z794 Long term (current) use of insulin: Secondary | ICD-10-CM | POA: Diagnosis not present

## 2020-05-15 DIAGNOSIS — E119 Type 2 diabetes mellitus without complications: Secondary | ICD-10-CM | POA: Diagnosis not present

## 2020-05-15 DIAGNOSIS — Z8639 Personal history of other endocrine, nutritional and metabolic disease: Secondary | ICD-10-CM | POA: Diagnosis not present

## 2020-05-15 DIAGNOSIS — I10 Essential (primary) hypertension: Secondary | ICD-10-CM | POA: Diagnosis not present

## 2020-05-15 DIAGNOSIS — K219 Gastro-esophageal reflux disease without esophagitis: Secondary | ICD-10-CM | POA: Diagnosis not present

## 2020-05-15 DIAGNOSIS — G4733 Obstructive sleep apnea (adult) (pediatric): Secondary | ICD-10-CM | POA: Diagnosis not present

## 2020-05-15 DIAGNOSIS — E785 Hyperlipidemia, unspecified: Secondary | ICD-10-CM | POA: Diagnosis not present

## 2020-05-15 DIAGNOSIS — N401 Enlarged prostate with lower urinary tract symptoms: Secondary | ICD-10-CM | POA: Diagnosis not present

## 2020-05-15 DIAGNOSIS — Z0000 Encounter for general adult medical examination without abnormal findings: Secondary | ICD-10-CM | POA: Diagnosis not present

## 2020-06-05 DIAGNOSIS — C61 Malignant neoplasm of prostate: Secondary | ICD-10-CM | POA: Diagnosis not present

## 2020-06-05 DIAGNOSIS — R310 Gross hematuria: Secondary | ICD-10-CM | POA: Diagnosis not present

## 2020-06-05 DIAGNOSIS — Z8042 Family history of malignant neoplasm of prostate: Secondary | ICD-10-CM | POA: Diagnosis not present

## 2020-06-05 DIAGNOSIS — N400 Enlarged prostate without lower urinary tract symptoms: Secondary | ICD-10-CM | POA: Diagnosis not present

## 2020-09-10 DIAGNOSIS — Z9989 Dependence on other enabling machines and devices: Secondary | ICD-10-CM | POA: Diagnosis not present

## 2020-09-10 DIAGNOSIS — E7800 Pure hypercholesterolemia, unspecified: Secondary | ICD-10-CM | POA: Diagnosis not present

## 2020-09-10 DIAGNOSIS — G4733 Obstructive sleep apnea (adult) (pediatric): Secondary | ICD-10-CM | POA: Diagnosis not present

## 2020-09-10 DIAGNOSIS — E1165 Type 2 diabetes mellitus with hyperglycemia: Secondary | ICD-10-CM | POA: Diagnosis not present

## 2020-09-10 DIAGNOSIS — N401 Enlarged prostate with lower urinary tract symptoms: Secondary | ICD-10-CM | POA: Diagnosis not present

## 2020-09-17 DIAGNOSIS — E7800 Pure hypercholesterolemia, unspecified: Secondary | ICD-10-CM | POA: Diagnosis not present

## 2020-09-17 DIAGNOSIS — E213 Hyperparathyroidism, unspecified: Secondary | ICD-10-CM | POA: Diagnosis not present

## 2020-09-17 DIAGNOSIS — I10 Essential (primary) hypertension: Secondary | ICD-10-CM | POA: Diagnosis not present

## 2020-09-17 DIAGNOSIS — G4733 Obstructive sleep apnea (adult) (pediatric): Secondary | ICD-10-CM | POA: Diagnosis not present

## 2020-09-17 DIAGNOSIS — E119 Type 2 diabetes mellitus without complications: Secondary | ICD-10-CM | POA: Diagnosis not present

## 2020-09-17 DIAGNOSIS — Z794 Long term (current) use of insulin: Secondary | ICD-10-CM | POA: Diagnosis not present

## 2020-10-01 DIAGNOSIS — G4733 Obstructive sleep apnea (adult) (pediatric): Secondary | ICD-10-CM | POA: Diagnosis not present

## 2020-10-01 DIAGNOSIS — I10 Essential (primary) hypertension: Secondary | ICD-10-CM | POA: Diagnosis not present

## 2020-12-11 DIAGNOSIS — H524 Presbyopia: Secondary | ICD-10-CM | POA: Diagnosis not present

## 2020-12-11 DIAGNOSIS — Z0100 Encounter for examination of eyes and vision without abnormal findings: Secondary | ICD-10-CM | POA: Diagnosis not present

## 2020-12-18 DIAGNOSIS — D400 Neoplasm of uncertain behavior of prostate: Secondary | ICD-10-CM | POA: Diagnosis not present

## 2020-12-18 DIAGNOSIS — N401 Enlarged prostate with lower urinary tract symptoms: Secondary | ICD-10-CM | POA: Diagnosis not present

## 2020-12-18 DIAGNOSIS — C61 Malignant neoplasm of prostate: Secondary | ICD-10-CM | POA: Diagnosis not present

## 2021-01-15 DIAGNOSIS — Z794 Long term (current) use of insulin: Secondary | ICD-10-CM | POA: Diagnosis not present

## 2021-01-15 DIAGNOSIS — Z9989 Dependence on other enabling machines and devices: Secondary | ICD-10-CM | POA: Diagnosis not present

## 2021-01-15 DIAGNOSIS — E7800 Pure hypercholesterolemia, unspecified: Secondary | ICD-10-CM | POA: Diagnosis not present

## 2021-01-15 DIAGNOSIS — E213 Hyperparathyroidism, unspecified: Secondary | ICD-10-CM | POA: Diagnosis not present

## 2021-01-15 DIAGNOSIS — G4733 Obstructive sleep apnea (adult) (pediatric): Secondary | ICD-10-CM | POA: Diagnosis not present

## 2021-01-15 DIAGNOSIS — I10 Essential (primary) hypertension: Secondary | ICD-10-CM | POA: Diagnosis not present

## 2021-01-15 DIAGNOSIS — E1165 Type 2 diabetes mellitus with hyperglycemia: Secondary | ICD-10-CM | POA: Diagnosis not present

## 2021-01-22 DIAGNOSIS — Z0000 Encounter for general adult medical examination without abnormal findings: Secondary | ICD-10-CM | POA: Diagnosis not present

## 2021-01-22 DIAGNOSIS — E1165 Type 2 diabetes mellitus with hyperglycemia: Secondary | ICD-10-CM | POA: Diagnosis not present

## 2021-01-22 DIAGNOSIS — Z79899 Other long term (current) drug therapy: Secondary | ICD-10-CM | POA: Diagnosis not present

## 2021-01-22 DIAGNOSIS — Z8546 Personal history of malignant neoplasm of prostate: Secondary | ICD-10-CM | POA: Diagnosis not present

## 2021-01-22 DIAGNOSIS — E785 Hyperlipidemia, unspecified: Secondary | ICD-10-CM | POA: Diagnosis not present

## 2021-01-22 DIAGNOSIS — G4733 Obstructive sleep apnea (adult) (pediatric): Secondary | ICD-10-CM | POA: Diagnosis not present

## 2021-01-22 DIAGNOSIS — E119 Type 2 diabetes mellitus without complications: Secondary | ICD-10-CM | POA: Diagnosis not present

## 2021-01-22 DIAGNOSIS — Z794 Long term (current) use of insulin: Secondary | ICD-10-CM | POA: Diagnosis not present

## 2021-02-25 DIAGNOSIS — I10 Essential (primary) hypertension: Secondary | ICD-10-CM | POA: Diagnosis not present

## 2021-02-25 DIAGNOSIS — G4733 Obstructive sleep apnea (adult) (pediatric): Secondary | ICD-10-CM | POA: Diagnosis not present

## 2021-05-17 DIAGNOSIS — E1165 Type 2 diabetes mellitus with hyperglycemia: Secondary | ICD-10-CM | POA: Diagnosis not present

## 2021-05-17 DIAGNOSIS — G4733 Obstructive sleep apnea (adult) (pediatric): Secondary | ICD-10-CM | POA: Diagnosis not present

## 2021-05-17 DIAGNOSIS — Z9989 Dependence on other enabling machines and devices: Secondary | ICD-10-CM | POA: Diagnosis not present

## 2021-05-17 DIAGNOSIS — Z0000 Encounter for general adult medical examination without abnormal findings: Secondary | ICD-10-CM | POA: Diagnosis not present

## 2021-05-17 DIAGNOSIS — G479 Sleep disorder, unspecified: Secondary | ICD-10-CM | POA: Diagnosis not present

## 2021-05-17 DIAGNOSIS — Z794 Long term (current) use of insulin: Secondary | ICD-10-CM | POA: Diagnosis not present

## 2021-05-17 DIAGNOSIS — E7800 Pure hypercholesterolemia, unspecified: Secondary | ICD-10-CM | POA: Diagnosis not present

## 2021-05-17 DIAGNOSIS — N401 Enlarged prostate with lower urinary tract symptoms: Secondary | ICD-10-CM | POA: Diagnosis not present

## 2021-05-24 DIAGNOSIS — E119 Type 2 diabetes mellitus without complications: Secondary | ICD-10-CM | POA: Diagnosis not present

## 2021-05-24 DIAGNOSIS — Z9989 Dependence on other enabling machines and devices: Secondary | ICD-10-CM | POA: Diagnosis not present

## 2021-05-24 DIAGNOSIS — G4733 Obstructive sleep apnea (adult) (pediatric): Secondary | ICD-10-CM | POA: Diagnosis not present

## 2021-05-24 DIAGNOSIS — R002 Palpitations: Secondary | ICD-10-CM | POA: Diagnosis not present

## 2021-05-24 DIAGNOSIS — Z794 Long term (current) use of insulin: Secondary | ICD-10-CM | POA: Diagnosis not present

## 2021-05-24 DIAGNOSIS — C61 Malignant neoplasm of prostate: Secondary | ICD-10-CM | POA: Diagnosis not present

## 2021-05-24 DIAGNOSIS — M79601 Pain in right arm: Secondary | ICD-10-CM | POA: Diagnosis not present

## 2021-06-12 DIAGNOSIS — G4733 Obstructive sleep apnea (adult) (pediatric): Secondary | ICD-10-CM | POA: Diagnosis not present

## 2021-06-12 DIAGNOSIS — I10 Essential (primary) hypertension: Secondary | ICD-10-CM | POA: Diagnosis not present

## 2021-06-19 DIAGNOSIS — C61 Malignant neoplasm of prostate: Secondary | ICD-10-CM | POA: Diagnosis not present

## 2021-06-19 DIAGNOSIS — N401 Enlarged prostate with lower urinary tract symptoms: Secondary | ICD-10-CM | POA: Diagnosis not present

## 2021-06-19 DIAGNOSIS — D400 Neoplasm of uncertain behavior of prostate: Secondary | ICD-10-CM | POA: Diagnosis not present

## 2021-08-22 DIAGNOSIS — R002 Palpitations: Secondary | ICD-10-CM | POA: Diagnosis not present

## 2021-09-24 DIAGNOSIS — Z9989 Dependence on other enabling machines and devices: Secondary | ICD-10-CM | POA: Diagnosis not present

## 2021-09-24 DIAGNOSIS — M79601 Pain in right arm: Secondary | ICD-10-CM | POA: Diagnosis not present

## 2021-09-24 DIAGNOSIS — Z794 Long term (current) use of insulin: Secondary | ICD-10-CM | POA: Diagnosis not present

## 2021-09-24 DIAGNOSIS — E1165 Type 2 diabetes mellitus with hyperglycemia: Secondary | ICD-10-CM | POA: Diagnosis not present

## 2021-09-24 DIAGNOSIS — G4733 Obstructive sleep apnea (adult) (pediatric): Secondary | ICD-10-CM | POA: Diagnosis not present

## 2021-09-24 DIAGNOSIS — N401 Enlarged prostate with lower urinary tract symptoms: Secondary | ICD-10-CM | POA: Diagnosis not present

## 2021-09-24 DIAGNOSIS — Z125 Encounter for screening for malignant neoplasm of prostate: Secondary | ICD-10-CM | POA: Diagnosis not present

## 2021-09-24 DIAGNOSIS — R002 Palpitations: Secondary | ICD-10-CM | POA: Diagnosis not present

## 2021-09-24 DIAGNOSIS — D649 Anemia, unspecified: Secondary | ICD-10-CM | POA: Diagnosis not present

## 2021-10-01 DIAGNOSIS — Z794 Long term (current) use of insulin: Secondary | ICD-10-CM | POA: Diagnosis not present

## 2021-10-01 DIAGNOSIS — G4733 Obstructive sleep apnea (adult) (pediatric): Secondary | ICD-10-CM | POA: Diagnosis not present

## 2021-10-01 DIAGNOSIS — C61 Malignant neoplasm of prostate: Secondary | ICD-10-CM | POA: Diagnosis not present

## 2021-10-01 DIAGNOSIS — E119 Type 2 diabetes mellitus without complications: Secondary | ICD-10-CM | POA: Diagnosis not present

## 2021-10-01 DIAGNOSIS — Z6830 Body mass index (BMI) 30.0-30.9, adult: Secondary | ICD-10-CM | POA: Diagnosis not present

## 2021-10-01 DIAGNOSIS — I472 Ventricular tachycardia, unspecified: Secondary | ICD-10-CM | POA: Diagnosis not present

## 2021-10-01 DIAGNOSIS — U071 COVID-19: Secondary | ICD-10-CM | POA: Diagnosis not present

## 2021-10-01 DIAGNOSIS — I10 Essential (primary) hypertension: Secondary | ICD-10-CM | POA: Diagnosis not present

## 2021-10-01 DIAGNOSIS — Z9989 Dependence on other enabling machines and devices: Secondary | ICD-10-CM | POA: Diagnosis not present

## 2021-10-17 DIAGNOSIS — G4733 Obstructive sleep apnea (adult) (pediatric): Secondary | ICD-10-CM | POA: Diagnosis not present

## 2021-10-17 DIAGNOSIS — I10 Essential (primary) hypertension: Secondary | ICD-10-CM | POA: Diagnosis not present

## 2021-10-17 DIAGNOSIS — Z7689 Persons encountering health services in other specified circumstances: Secondary | ICD-10-CM | POA: Diagnosis not present

## 2021-10-17 DIAGNOSIS — Z9989 Dependence on other enabling machines and devices: Secondary | ICD-10-CM | POA: Diagnosis not present

## 2021-10-17 DIAGNOSIS — R0602 Shortness of breath: Secondary | ICD-10-CM | POA: Diagnosis not present

## 2021-10-17 DIAGNOSIS — R9431 Abnormal electrocardiogram [ECG] [EKG]: Secondary | ICD-10-CM | POA: Diagnosis not present

## 2021-10-17 DIAGNOSIS — E7800 Pure hypercholesterolemia, unspecified: Secondary | ICD-10-CM | POA: Diagnosis not present

## 2021-10-17 DIAGNOSIS — Z794 Long term (current) use of insulin: Secondary | ICD-10-CM | POA: Diagnosis not present

## 2021-10-17 DIAGNOSIS — E1165 Type 2 diabetes mellitus with hyperglycemia: Secondary | ICD-10-CM | POA: Diagnosis not present

## 2021-11-07 DIAGNOSIS — R0602 Shortness of breath: Secondary | ICD-10-CM | POA: Diagnosis not present

## 2021-11-07 DIAGNOSIS — R9431 Abnormal electrocardiogram [ECG] [EKG]: Secondary | ICD-10-CM | POA: Diagnosis not present

## 2021-11-14 ENCOUNTER — Other Ambulatory Visit
Admission: RE | Admit: 2021-11-14 | Discharge: 2021-11-14 | Disposition: A | Discharge status: Home / Self Care | Payer: Medicare HMO | Source: Ambulatory Visit | Attending: Internal Medicine | Admitting: Internal Medicine

## 2021-11-14 DIAGNOSIS — R0602 Shortness of breath: Secondary | ICD-10-CM | POA: Insufficient documentation

## 2021-11-14 DIAGNOSIS — Z9989 Dependence on other enabling machines and devices: Secondary | ICD-10-CM | POA: Insufficient documentation

## 2021-11-14 DIAGNOSIS — G4733 Obstructive sleep apnea (adult) (pediatric): Secondary | ICD-10-CM | POA: Insufficient documentation

## 2021-11-14 DIAGNOSIS — E7800 Pure hypercholesterolemia, unspecified: Secondary | ICD-10-CM | POA: Diagnosis not present

## 2021-11-14 DIAGNOSIS — Z01818 Encounter for other preprocedural examination: Secondary | ICD-10-CM | POA: Insufficient documentation

## 2021-11-14 DIAGNOSIS — I10 Essential (primary) hypertension: Secondary | ICD-10-CM | POA: Diagnosis not present

## 2021-11-14 LAB — BRAIN NATRIURETIC PEPTIDE: B Natriuretic Peptide: 34.1 pg/mL (ref 0.0–100.0)

## 2021-11-20 DIAGNOSIS — I209 Angina pectoris, unspecified: Secondary | ICD-10-CM | POA: Diagnosis present

## 2021-11-27 ENCOUNTER — Encounter: Payer: Self-pay | Admitting: Internal Medicine

## 2021-11-27 ENCOUNTER — Other Ambulatory Visit: Payer: Self-pay

## 2021-11-27 ENCOUNTER — Encounter
Admission: RE | Disposition: A | Discharge status: Home / Self Care | Payer: Self-pay | Source: Home / Self Care | Attending: Internal Medicine

## 2021-11-27 ENCOUNTER — Ambulatory Visit
Admission: RE | Admit: 2021-11-27 | Discharge: 2021-11-27 | Disposition: A | Discharge status: Home / Self Care | Payer: Medicare HMO | Attending: Internal Medicine | Admitting: Internal Medicine

## 2021-11-27 DIAGNOSIS — R9430 Abnormal result of cardiovascular function study, unspecified: Secondary | ICD-10-CM

## 2021-11-27 DIAGNOSIS — I208 Other forms of angina pectoris: Secondary | ICD-10-CM

## 2021-11-27 DIAGNOSIS — I10 Essential (primary) hypertension: Secondary | ICD-10-CM | POA: Insufficient documentation

## 2021-11-27 DIAGNOSIS — E119 Type 2 diabetes mellitus without complications: Secondary | ICD-10-CM | POA: Insufficient documentation

## 2021-11-27 DIAGNOSIS — I2582 Chronic total occlusion of coronary artery: Secondary | ICD-10-CM | POA: Diagnosis not present

## 2021-11-27 DIAGNOSIS — I25118 Atherosclerotic heart disease of native coronary artery with other forms of angina pectoris: Secondary | ICD-10-CM | POA: Diagnosis not present

## 2021-11-27 DIAGNOSIS — I209 Angina pectoris, unspecified: Secondary | ICD-10-CM | POA: Diagnosis present

## 2021-11-27 DIAGNOSIS — I25110 Atherosclerotic heart disease of native coronary artery with unstable angina pectoris: Secondary | ICD-10-CM | POA: Diagnosis not present

## 2021-11-27 HISTORY — PX: LEFT HEART CATH AND CORONARY ANGIOGRAPHY: CATH118249

## 2021-11-27 LAB — GLUCOSE, CAPILLARY
Glucose-Capillary: 139 mg/dL — ABNORMAL HIGH (ref 70–99)
Glucose-Capillary: 177 mg/dL — ABNORMAL HIGH (ref 70–99)

## 2021-11-27 SURGERY — LEFT HEART CATH AND CORONARY ANGIOGRAPHY
Anesthesia: Moderate Sedation

## 2021-11-27 MED ORDER — VERAPAMIL HCL 2.5 MG/ML IV SOLN
INTRAVENOUS | Status: AC
  Filled 2021-11-27: qty 2

## 2021-11-27 MED ORDER — LIDOCAINE HCL (PF) 1 % IJ SOLN
INTRAMUSCULAR | Status: DC | PRN
  Administered 2021-11-27: 2 mL

## 2021-11-27 MED ORDER — ASPIRIN 81 MG PO CHEW: 81.0000 mg | CHEWABLE_TABLET | ORAL | Status: AC

## 2021-11-27 MED ORDER — MIDAZOLAM HCL 2 MG/2ML IJ SOLN
INTRAMUSCULAR | Status: AC
  Filled 2021-11-27: qty 2

## 2021-11-27 MED ORDER — HEPARIN SODIUM (PORCINE) 1000 UNIT/ML IJ SOLN
INTRAMUSCULAR | Status: DC | PRN
  Administered 2021-11-27: 4000 [IU] via INTRAVENOUS

## 2021-11-27 MED ORDER — LIDOCAINE HCL 1 % IJ SOLN
INTRAMUSCULAR | Status: AC
  Filled 2021-11-27: qty 20

## 2021-11-27 MED ORDER — IOHEXOL 300 MG/ML  SOLN
INTRAMUSCULAR | Status: DC | PRN
  Administered 2021-11-27: 09:00:00 70 mL

## 2021-11-27 MED ORDER — FENTANYL CITRATE (PF) 100 MCG/2ML IJ SOLN
INTRAMUSCULAR | Status: DC | PRN
  Administered 2021-11-27: 25 ug via INTRAVENOUS

## 2021-11-27 MED ORDER — HEPARIN SODIUM (PORCINE) 1000 UNIT/ML IJ SOLN
INTRAMUSCULAR | Status: AC
  Filled 2021-11-27: qty 10

## 2021-11-27 MED ORDER — SODIUM CHLORIDE 0.9 % WEIGHT BASED INFUSION: 3.0000 mL/kg/h | INTRAVENOUS | Status: AC

## 2021-11-27 MED ORDER — ACETAMINOPHEN 325 MG PO TABS: 650.0000 mg | ORAL_TABLET | ORAL | Status: DC | PRN

## 2021-11-27 MED ORDER — SODIUM CHLORIDE 0.9% FLUSH: 3.0000 mL | INTRAVENOUS | Status: DC | PRN

## 2021-11-27 MED ORDER — SODIUM CHLORIDE 0.9 % WEIGHT BASED INFUSION
3.0000 mL/kg/h | INTRAVENOUS | Status: DC
  Administered 2021-11-27: 08:00:00 3 mL/kg/h via INTRAVENOUS

## 2021-11-27 MED ORDER — SODIUM CHLORIDE 0.9 % WEIGHT BASED INFUSION: 1.0000 mL/kg/h | INTRAVENOUS | Status: DC

## 2021-11-27 MED ORDER — SODIUM CHLORIDE 0.9 % IV SOLN: 250.0000 mL | INTRAVENOUS | Status: DC | PRN

## 2021-11-27 MED ORDER — LABETALOL HCL 5 MG/ML IV SOLN: 10.0000 mg | INTRAVENOUS | Status: DC | PRN

## 2021-11-27 MED ORDER — HYDRALAZINE HCL 20 MG/ML IJ SOLN: 10.0000 mg | INTRAMUSCULAR | Status: DC | PRN

## 2021-11-27 MED ORDER — MIDAZOLAM HCL 2 MG/2ML IJ SOLN
INTRAMUSCULAR | Status: DC | PRN
  Administered 2021-11-27: 1 mg via INTRAVENOUS

## 2021-11-27 MED ORDER — VERAPAMIL HCL 2.5 MG/ML IV SOLN
INTRAVENOUS | Status: DC | PRN
  Administered 2021-11-27: 2.5 mg via INTRA_ARTERIAL

## 2021-11-27 MED ORDER — ONDANSETRON HCL 4 MG/2ML IJ SOLN: 4.0000 mg | Freq: Four times a day (QID) | INTRAMUSCULAR | Status: DC | PRN

## 2021-11-27 MED ORDER — SODIUM CHLORIDE 0.9% FLUSH: 3.0000 mL | Freq: Two times a day (BID) | INTRAVENOUS | Status: DC

## 2021-11-27 MED ORDER — HEPARIN (PORCINE) IN NACL 1000-0.9 UT/500ML-% IV SOLN
INTRAVENOUS | Status: AC
  Filled 2021-11-27: qty 1000

## 2021-11-27 MED ORDER — ASPIRIN 81 MG PO CHEW
CHEWABLE_TABLET | ORAL | Status: AC
  Administered 2021-11-27: 08:00:00 81 mg via ORAL
  Filled 2021-11-27: qty 1

## 2021-11-27 MED ORDER — HEPARIN (PORCINE) IN NACL 1000-0.9 UT/500ML-% IV SOLN
INTRAVENOUS | Status: DC | PRN
  Administered 2021-11-27 (×2): 500 mL

## 2021-11-27 MED ORDER — FENTANYL CITRATE (PF) 100 MCG/2ML IJ SOLN
INTRAMUSCULAR | Status: AC
  Filled 2021-11-27: qty 2

## 2021-11-27 SURGICAL SUPPLY — 10 items
CATH 5FR JL3.5 JR4 ANG PIG MP (CATHETERS) ×1 IMPLANT
DEVICE RAD TR BAND REGULAR (VASCULAR PRODUCTS) ×1 IMPLANT
DRAPE BRACHIAL (DRAPES) ×1 IMPLANT
GLIDESHEATH SLEND SS 6F .021 (SHEATH) ×1 IMPLANT
GUIDEWIRE INQWIRE 1.5J.035X260 (WIRE) IMPLANT
INQWIRE 1.5J .035X260CM (WIRE) ×2
PACK CARDIAC CATH (CUSTOM PROCEDURE TRAY) ×2 IMPLANT
PROTECTION STATION PRESSURIZED (MISCELLANEOUS) ×2
SET ATX SIMPLICITY (MISCELLANEOUS) ×1 IMPLANT
STATION PROTECTION PRESSURIZED (MISCELLANEOUS) IMPLANT

## 2021-11-28 DIAGNOSIS — I209 Angina pectoris, unspecified: Secondary | ICD-10-CM | POA: Diagnosis present

## 2021-11-28 DIAGNOSIS — R9430 Abnormal result of cardiovascular function study, unspecified: Secondary | ICD-10-CM

## 2021-11-28 DIAGNOSIS — I208 Other forms of angina pectoris: Secondary | ICD-10-CM

## 2021-11-29 DIAGNOSIS — E119 Type 2 diabetes mellitus without complications: Secondary | ICD-10-CM | POA: Diagnosis not present

## 2021-12-02 DIAGNOSIS — I25118 Atherosclerotic heart disease of native coronary artery with other forms of angina pectoris: Secondary | ICD-10-CM | POA: Diagnosis not present

## 2021-12-02 DIAGNOSIS — I10 Essential (primary) hypertension: Secondary | ICD-10-CM | POA: Diagnosis not present

## 2021-12-02 DIAGNOSIS — I2510 Atherosclerotic heart disease of native coronary artery without angina pectoris: Secondary | ICD-10-CM | POA: Insufficient documentation

## 2021-12-02 DIAGNOSIS — I25708 Atherosclerosis of coronary artery bypass graft(s), unspecified, with other forms of angina pectoris: Secondary | ICD-10-CM | POA: Diagnosis not present

## 2021-12-02 DIAGNOSIS — E7800 Pure hypercholesterolemia, unspecified: Secondary | ICD-10-CM | POA: Diagnosis not present

## 2021-12-03 DIAGNOSIS — I340 Nonrheumatic mitral (valve) insufficiency: Secondary | ICD-10-CM | POA: Insufficient documentation

## 2021-12-03 DIAGNOSIS — I5189 Other ill-defined heart diseases: Secondary | ICD-10-CM | POA: Insufficient documentation

## 2021-12-09 DIAGNOSIS — E119 Type 2 diabetes mellitus without complications: Secondary | ICD-10-CM | POA: Diagnosis not present

## 2021-12-09 DIAGNOSIS — I25119 Atherosclerotic heart disease of native coronary artery with unspecified angina pectoris: Secondary | ICD-10-CM | POA: Diagnosis not present

## 2021-12-16 DIAGNOSIS — Z4682 Encounter for fitting and adjustment of non-vascular catheter: Secondary | ICD-10-CM | POA: Diagnosis not present

## 2021-12-16 DIAGNOSIS — Z951 Presence of aortocoronary bypass graft: Secondary | ICD-10-CM | POA: Diagnosis not present

## 2021-12-16 DIAGNOSIS — J948 Other specified pleural conditions: Secondary | ICD-10-CM | POA: Diagnosis not present

## 2021-12-16 DIAGNOSIS — J982 Interstitial emphysema: Secondary | ICD-10-CM | POA: Diagnosis not present

## 2021-12-16 DIAGNOSIS — I25118 Atherosclerotic heart disease of native coronary artery with other forms of angina pectoris: Secondary | ICD-10-CM | POA: Diagnosis not present

## 2021-12-16 DIAGNOSIS — R918 Other nonspecific abnormal finding of lung field: Secondary | ICD-10-CM | POA: Diagnosis not present

## 2021-12-16 DIAGNOSIS — Z9989 Dependence on other enabling machines and devices: Secondary | ICD-10-CM | POA: Diagnosis not present

## 2021-12-16 DIAGNOSIS — I25119 Atherosclerotic heart disease of native coronary artery with unspecified angina pectoris: Secondary | ICD-10-CM | POA: Diagnosis not present

## 2021-12-16 DIAGNOSIS — N400 Enlarged prostate without lower urinary tract symptoms: Secondary | ICD-10-CM | POA: Diagnosis not present

## 2021-12-16 DIAGNOSIS — J90 Pleural effusion, not elsewhere classified: Secondary | ICD-10-CM | POA: Diagnosis not present

## 2021-12-16 DIAGNOSIS — I97790 Other intraoperative cardiac functional disturbances during cardiac surgery: Secondary | ICD-10-CM | POA: Diagnosis not present

## 2021-12-16 DIAGNOSIS — I340 Nonrheumatic mitral (valve) insufficiency: Secondary | ICD-10-CM | POA: Diagnosis not present

## 2021-12-16 DIAGNOSIS — E892 Postprocedural hypoparathyroidism: Secondary | ICD-10-CM | POA: Diagnosis not present

## 2021-12-16 DIAGNOSIS — I2510 Atherosclerotic heart disease of native coronary artery without angina pectoris: Secondary | ICD-10-CM | POA: Diagnosis not present

## 2021-12-16 DIAGNOSIS — D72829 Elevated white blood cell count, unspecified: Secondary | ICD-10-CM | POA: Diagnosis not present

## 2021-12-16 DIAGNOSIS — J9811 Atelectasis: Secondary | ICD-10-CM | POA: Diagnosis not present

## 2021-12-16 DIAGNOSIS — G4733 Obstructive sleep apnea (adult) (pediatric): Secondary | ICD-10-CM | POA: Diagnosis not present

## 2021-12-16 DIAGNOSIS — E785 Hyperlipidemia, unspecified: Secondary | ICD-10-CM | POA: Diagnosis not present

## 2021-12-16 DIAGNOSIS — I4891 Unspecified atrial fibrillation: Secondary | ICD-10-CM | POA: Diagnosis not present

## 2021-12-16 DIAGNOSIS — G479 Sleep disorder, unspecified: Secondary | ICD-10-CM | POA: Diagnosis not present

## 2021-12-16 DIAGNOSIS — I700 Atherosclerosis of aorta: Secondary | ICD-10-CM | POA: Diagnosis not present

## 2021-12-16 DIAGNOSIS — E1165 Type 2 diabetes mellitus with hyperglycemia: Secondary | ICD-10-CM | POA: Diagnosis not present

## 2021-12-16 DIAGNOSIS — J939 Pneumothorax, unspecified: Secondary | ICD-10-CM | POA: Diagnosis not present

## 2021-12-16 DIAGNOSIS — D649 Anemia, unspecified: Secondary | ICD-10-CM | POA: Diagnosis not present

## 2021-12-16 DIAGNOSIS — Z9889 Other specified postprocedural states: Secondary | ICD-10-CM | POA: Diagnosis not present

## 2021-12-16 DIAGNOSIS — E118 Type 2 diabetes mellitus with unspecified complications: Secondary | ICD-10-CM | POA: Diagnosis not present

## 2021-12-16 DIAGNOSIS — I10 Essential (primary) hypertension: Secondary | ICD-10-CM | POA: Diagnosis not present

## 2021-12-16 DIAGNOSIS — Z01818 Encounter for other preprocedural examination: Secondary | ICD-10-CM | POA: Diagnosis not present

## 2021-12-16 DIAGNOSIS — I119 Hypertensive heart disease without heart failure: Secondary | ICD-10-CM | POA: Diagnosis not present

## 2021-12-18 DIAGNOSIS — Z951 Presence of aortocoronary bypass graft: Secondary | ICD-10-CM | POA: Insufficient documentation

## 2021-12-27 DIAGNOSIS — I10 Essential (primary) hypertension: Secondary | ICD-10-CM | POA: Diagnosis not present

## 2021-12-27 DIAGNOSIS — G4733 Obstructive sleep apnea (adult) (pediatric): Secondary | ICD-10-CM | POA: Diagnosis not present

## 2021-12-27 DIAGNOSIS — Z6829 Body mass index (BMI) 29.0-29.9, adult: Secondary | ICD-10-CM | POA: Diagnosis not present

## 2021-12-27 DIAGNOSIS — E1165 Type 2 diabetes mellitus with hyperglycemia: Secondary | ICD-10-CM | POA: Diagnosis not present

## 2021-12-27 DIAGNOSIS — E785 Hyperlipidemia, unspecified: Secondary | ICD-10-CM | POA: Diagnosis not present

## 2021-12-27 DIAGNOSIS — Z9989 Dependence on other enabling machines and devices: Secondary | ICD-10-CM | POA: Diagnosis not present

## 2021-12-27 DIAGNOSIS — Z951 Presence of aortocoronary bypass graft: Secondary | ICD-10-CM | POA: Diagnosis not present

## 2021-12-27 DIAGNOSIS — J329 Chronic sinusitis, unspecified: Secondary | ICD-10-CM | POA: Diagnosis not present

## 2021-12-27 DIAGNOSIS — Z09 Encounter for follow-up examination after completed treatment for conditions other than malignant neoplasm: Secondary | ICD-10-CM | POA: Diagnosis not present

## 2022-01-06 DIAGNOSIS — I493 Ventricular premature depolarization: Secondary | ICD-10-CM | POA: Diagnosis not present

## 2022-01-06 DIAGNOSIS — J9811 Atelectasis: Secondary | ICD-10-CM | POA: Diagnosis not present

## 2022-01-06 DIAGNOSIS — Z48812 Encounter for surgical aftercare following surgery on the circulatory system: Secondary | ICD-10-CM | POA: Diagnosis not present

## 2022-01-06 DIAGNOSIS — R918 Other nonspecific abnormal finding of lung field: Secondary | ICD-10-CM | POA: Diagnosis not present

## 2022-01-06 DIAGNOSIS — Z9889 Other specified postprocedural states: Secondary | ICD-10-CM | POA: Diagnosis not present

## 2022-01-06 DIAGNOSIS — Z7982 Long term (current) use of aspirin: Secondary | ICD-10-CM | POA: Diagnosis not present

## 2022-01-06 DIAGNOSIS — J90 Pleural effusion, not elsewhere classified: Secondary | ICD-10-CM | POA: Diagnosis not present

## 2022-01-06 DIAGNOSIS — Z951 Presence of aortocoronary bypass graft: Secondary | ICD-10-CM | POA: Diagnosis not present

## 2022-01-06 DIAGNOSIS — I25119 Atherosclerotic heart disease of native coronary artery with unspecified angina pectoris: Secondary | ICD-10-CM | POA: Diagnosis not present

## 2022-01-06 DIAGNOSIS — E119 Type 2 diabetes mellitus without complications: Secondary | ICD-10-CM | POA: Diagnosis not present

## 2022-01-06 DIAGNOSIS — I4891 Unspecified atrial fibrillation: Secondary | ICD-10-CM | POA: Diagnosis not present

## 2022-01-10 ENCOUNTER — Encounter: Payer: Medicare HMO | Attending: Internal Medicine | Admitting: *Deleted

## 2022-01-10 ENCOUNTER — Other Ambulatory Visit: Payer: Self-pay

## 2022-01-10 DIAGNOSIS — Z951 Presence of aortocoronary bypass graft: Secondary | ICD-10-CM | POA: Insufficient documentation

## 2022-01-10 DIAGNOSIS — Z79899 Other long term (current) drug therapy: Secondary | ICD-10-CM | POA: Insufficient documentation

## 2022-01-10 NOTE — Progress Notes (Signed)
Initial telephone orientation completed. Diagnosis can be found in [facility] 2/14. EP orientation scheduled for Monday 3/20 at 3pm. ?

## 2022-01-17 DIAGNOSIS — I10 Essential (primary) hypertension: Secondary | ICD-10-CM | POA: Diagnosis not present

## 2022-01-17 DIAGNOSIS — I25810 Atherosclerosis of coronary artery bypass graft(s) without angina pectoris: Secondary | ICD-10-CM | POA: Diagnosis not present

## 2022-01-17 DIAGNOSIS — E7800 Pure hypercholesterolemia, unspecified: Secondary | ICD-10-CM | POA: Diagnosis not present

## 2022-01-20 ENCOUNTER — Other Ambulatory Visit: Payer: Self-pay

## 2022-01-20 VITALS — Ht 65.75 in | Wt 171.2 lb

## 2022-01-20 DIAGNOSIS — Z79899 Other long term (current) drug therapy: Secondary | ICD-10-CM | POA: Diagnosis not present

## 2022-01-20 DIAGNOSIS — Z951 Presence of aortocoronary bypass graft: Secondary | ICD-10-CM | POA: Diagnosis not present

## 2022-01-20 NOTE — Patient Instructions (Signed)
Patient Instructions ? ?Patient Details  ?Name: Jesse Curtis ?MRN: [id number] ?Date of Birth: 19-Mar-1946 ?Referring Provider:  Corey Skains, MD ? ?Below are your personal goals for exercise, nutrition, and risk factors. Our goal is to help you stay on track towards obtaining and maintaining these goals. We will be discussing your progress on these goals with you throughout the program. ? ?Initial Exercise Prescription: ? Initial Exercise Prescription - 01/20/22 1600   ? ?  ? Date of Initial Exercise RX and Referring Provider  ? Date 01/20/22   ? Referring Provider Nehemiah Massed   ?  ? Oxygen  ? Maintain Oxygen Saturation 88% or higher   ?  ? Treadmill  ? MPH 1.5   ? Grade 0   ? Minutes 15   ? METs 2   ?  ? NuStep  ? Level 2   ? SPM 80   ? Minutes 15   ? METs 1.9   ?  ? Recumbant Elliptical  ? Level 1   ? RPM 50   ? Minutes 15   ? METs 1.9   ?  ? REL-XR  ? Level 2   ? Speed 50   ? Minutes 15   ? METs 1.9   ?  ? Track  ? Laps 15   ? Minutes 15   ? METs 1.9   ?  ? Prescription Details  ? Frequency (times per week) 3   ? Duration Progress to 30 minutes of continuous aerobic without signs/symptoms of physical distress   ?  ? Intensity  ? THRR 40-80% of Max Heartrate 107-132   ? Ratings of Perceived Exertion 11-13   ? Perceived Dyspnea 0-4   ?  ? Progression  ? Progression Continue to progress workloads to maintain intensity without signs/symptoms of physical distress.   ?  ? Resistance Training  ? Training Prescription Yes   ? Weight 3 lb   ? Reps 10-15   ? ?  ?  ? ?  ? ? ?Exercise Goals: ?Frequency: Be able to perform aerobic exercise two to three times per week in program working toward 2-5 days per week of home exercise. ? ?Intensity: Work with a perceived exertion of 11 (fairly light) - 15 (hard) while following your exercise prescription.  We will make changes to your prescription with you as you progress through the program. ?  ?Duration: Be able to do 30 to 45 minutes of continuous aerobic exercise in addition  to a 5 minute warm-up and a 5 minute cool-down routine. ?  ?Nutrition Goals: ?Your personal nutrition goals will be established when you do your nutrition analysis with the dietician. ? ?The following are general nutrition guidelines to follow: ?Cholesterol < '200mg'$ /day ?Sodium < '1500mg'$ /day ?Fiber: Men over 50 yrs - 30 grams per day ? ?Personal Goals: ? Personal Goals and Risk Factors at Admission - 01/10/22 1309   ? ?  ? Core Components/Risk Factors/Patient Goals on Admission  ? Diabetes Yes   ? Intervention Provide education about signs/symptoms and action to take for hypo/hyperglycemia.;Provide education about proper nutrition, including hydration, and aerobic/resistive exercise prescription along with prescribed medications to achieve blood glucose in normal ranges: Fasting glucose 65-99 mg/dL   ? Expected Outcomes Short Term: Participant verbalizes understanding of the signs/symptoms and immediate care of hyper/hypoglycemia, proper foot care and importance of medication, aerobic/resistive exercise and nutrition plan for blood glucose control.;Long Term: Attainment of HbA1C < 7%.   ? Hypertension Yes   ?  Intervention Provide education on lifestyle modifcations including regular physical activity/exercise, weight management, moderate sodium restriction and increased consumption of fresh fruit, vegetables, and low fat dairy, alcohol moderation, and smoking cessation.;Monitor prescription use compliance.   ? Expected Outcomes Short Term: Continued assessment and intervention until BP is < 140/62m HG in hypertensive participants. < 130/825mHG in hypertensive participants with diabetes, heart failure or chronic kidney disease.;Long Term: Maintenance of blood pressure at goal levels.   ? Lipids Yes   ? Intervention Provide education and support for participant on nutrition & aerobic/resistive exercise along with prescribed medications to achieve LDL '70mg'$ , HDL >'40mg'$ .   ? Expected Outcomes Short Term: Participant  states understanding of desired cholesterol values and is compliant with medications prescribed. Participant is following exercise prescription and nutrition guidelines.;Long Term: Cholesterol controlled with medications as prescribed, with individualized exercise RX and with personalized nutrition plan. Value goals: LDL < '70mg'$ , HDL > 40 mg.   ? ?  ?  ? ?  ? ? ?Tobacco Use Initial Evaluation: ?Social History  ? ?Tobacco Use  ?Smoking Status Never  ?Smokeless Tobacco Never  ? ? ?Exercise Goals and Review: ? Exercise Goals   ? ? [place] 01/20/22 1613  ?  ?  ?  ?  ?  ? Exercise Goals  ? Increase Physical Activity Yes      ? Intervention Provide advice, education, support and counseling about physical activity/exercise needs.;Develop an individualized exercise prescription for aerobic and resistive training based on initial evaluation findings, risk stratification, comorbidities and participant's personal goals.      ? Expected Outcomes Short Term: Attend rehab on a regular basis to increase amount of physical activity.;Long Term: Add in home exercise to make exercise part of routine and to increase amount of physical activity.;Long Term: Exercising regularly at least 3-5 days a week.      ? Increase Strength and Stamina Yes      ? Intervention Provide advice, education, support and counseling about physical activity/exercise needs.;Develop an individualized exercise prescription for aerobic and resistive training based on initial evaluation findings, risk stratification, comorbidities and participant's personal goals.      ? Expected Outcomes Short Term: Increase workloads from initial exercise prescription for resistance, speed, and METs.;Short Term: Perform resistance training exercises routinely during rehab and add in resistance training at home;Long Term: Improve cardiorespiratory fitness, muscular endurance and strength as measured by increased METs and functional capacity (6MWT)      ? Able to understand and  use rate of perceived exertion (RPE) scale Yes      ? Intervention Provide education and explanation on how to use RPE scale      ? Expected Outcomes Short Term: Able to use RPE daily in rehab to express subjective intensity level;Long Term:  Able to use RPE to guide intensity level when exercising independently      ? Able to understand and use Dyspnea scale Yes      ? Intervention Provide education and explanation on how to use Dyspnea scale      ? Expected Outcomes Short Term: Able to use Dyspnea scale daily in rehab to express subjective sense of shortness of breath during exertion;Long Term: Able to use Dyspnea scale to guide intensity level when exercising independently      ? Knowledge and understanding of Target Heart Rate Range (THRR) Yes      ? Intervention Provide education and explanation of THRR including how the numbers were predicted and where they are located  for reference      ? Expected Outcomes Short Term: Able to state/look up THRR;Short Term: Able to use daily as guideline for intensity in rehab;Long Term: Able to use THRR to govern intensity when exercising independently      ? Able to check pulse independently Yes      ? Intervention Provide education and demonstration on how to check pulse in carotid and radial arteries.;Review the importance of being able to check your own pulse for safety during independent exercise      ? Expected Outcomes Short Term: Able to explain why pulse checking is important during independent exercise;Long Term: Able to check pulse independently and accurately      ? Understanding of Exercise Prescription Yes      ? Intervention Provide education, explanation, and written materials on patient's individual exercise prescription      ? Expected Outcomes Short Term: Able to explain program exercise prescription;Long Term: Able to explain home exercise prescription to exercise independently      ? ?  ?  ? ?  ? ? ?Copy of goals given to participant. ?

## 2022-01-20 NOTE — Progress Notes (Signed)
Cardiac Individual Treatment Plan ? ?Patient Details  ?Name: Jesse Curtis ?MRN: [id number] ?Date of Birth: 26-Aug-1946 ?Referring Provider:   ?Flowsheet Row Cardiac Rehab from 01/20/2022 in [facility] Cardiac and Pulmonary Rehab  ?Referring Provider Nehemiah Massed  ? ?  ? ? ?Initial Encounter Date:  ?Flowsheet Row Cardiac Rehab from 01/20/2022 in [facility] Cardiac and Pulmonary Rehab  ?Date 01/20/22  ? ?  ? ? ?Visit Diagnosis: S/P CABG x 4 ? ?Patient's Home Medications on Admission: ? ?Current Outpatient Medications:  ?  acetaminophen (TYLENOL) 650 MG CR tablet, Take 1,300 mg by mouth every 8 (eight) hours as needed for pain., Disp: , Rfl:  ?  amiodarone (PACERONE) 200 MG tablet, Take by mouth., Disp: , Rfl:  ?  Ascorbic Acid (VITAMIN C PO), Take 1,500 mg by mouth daily., Disp: , Rfl:  ?  aspirin 81 MG EC tablet, Take by mouth., Disp: , Rfl:  ?  atorvastatin (LIPITOR) 40 MG tablet, Take 40 mg by mouth daily., Disp: , Rfl:  ?  Cholecalciferol (VITAMIN D3) 125 MCG (5000 UT) CAPS, Take 5,000 Units by mouth daily., Disp: , Rfl:  ?  cyanocobalamin 1000 MCG tablet, Take 1 tablet by mouth daily., Disp: , Rfl:  ?  ferrous sulfate 325 (65 FE) MG tablet, Take 325 mg by mouth daily with breakfast., Disp: , Rfl:  ?  finasteride (PROSCAR) 5 MG tablet, Take 5 mg by mouth daily., Disp: , Rfl:  ?  glimepiride (AMARYL) 4 MG tablet, Take 4 mg by mouth daily with breakfast. (Patient not taking: Reported on 01/10/2022), Disp: , Rfl:  ?  insulin isophane & regular human KwikPen (NOVOLIN 70/30 KWIKPEN) (70-30) 100 UNIT/ML KwikPen, INJECT 28 UNITS SUBCUTANEOUSLY IN THE MORNING AND 20 UNITS AT NIGHT, Disp: , Rfl:  ?  lisinopril (ZESTRIL) 5 MG tablet, Take 5 mg by mouth daily. (Patient not taking: Reported on 01/10/2022), Disp: , Rfl:  ?  metFORMIN (GLUCOPHAGE) 500 MG tablet, Take 1,000 mg by mouth 2 (two) times daily., Disp: , Rfl:  ?  metoprolol tartrate (LOPRESSOR) 25 MG tablet, Take by mouth., Disp: , Rfl:  ?  Multiple Vitamin (MULTIVITAMIN WITH MINERALS)  TABS tablet, Take 1 tablet by mouth daily., Disp: , Rfl:  ?  Multiple Vitamins-Minerals (EYE VITAMINS & MINERALS PO), Take 1 tablet by mouth daily., Disp: , Rfl:  ?  Omega-3 Fatty Acids (FISH OIL) 1000 MG CAPS, Take 1,000 mg by mouth daily., Disp: , Rfl:  ?  omeprazole (PRILOSEC OTC) 20 MG tablet, Take 20 mg by mouth every other day. In the morning., Disp: , Rfl:  ?  pioglitazone (ACTOS) 30 MG tablet, Take 30 mg by mouth daily. (Patient not taking: Reported on 01/10/2022), Disp: , Rfl:  ?  Semaglutide,0.25 or 0.'5MG'$ /DOS, 2 MG/1.5ML SOPN, Inject into the skin., Disp: , Rfl:  ?  vitamin B-12 (CYANOCOBALAMIN) 1000 MCG tablet, Take 1,000 mcg by mouth daily. (Patient not taking: Reported on 01/10/2022), Disp: , Rfl:  ?  zinc gluconate 50 MG tablet, Take 50 mg by mouth daily. (Patient not taking: Reported on 01/10/2022), Disp: , Rfl:  ? ?Past Medical History: ?Past Medical History:  ?Diagnosis Date  ? 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 Diabetes mellitus without complication ([place])   ? Fatigue   ? POSSIBLE SEIZURE  NO EVAL  NO FUTHER PROBLEM  ? GERD (gastroesophageal reflux disease)   ? ULCERS  ? Hypertension   ? ? ?Tobacco Use: ?Social History  ? ?Tobacco Use  ?Smoking Status Never  ?  Smokeless Tobacco Never  ? ? ?Labs: ?Recent Review Flowsheet Data   ?There is no flowsheet data to display. ?  ? ? ? ?Exercise Target Goals: ?Exercise Program Goal: ?Individual exercise prescription set using results from initial 6 min walk test and THRR while considering  patient?s activity barriers and safety.  ? ?Exercise Prescription Goal: ?Initial exercise prescription builds to 30-45 minutes a day of aerobic activity, 2-3 days per week.  Home exercise guidelines will be given to patient during program as part of exercise prescription that the participant will acknowledge. ? ? ?Education: Aerobic Exercise: ?- Group verbal and visual presentation on the components of exercise prescription. Introduces F.I.T.T principle from ACSM for exercise  prescriptions.  Reviews F.I.T.T. principles of aerobic exercise including progression. Written material given at graduation. ? ? ?Education: Resistance Exercise: ?- Group verbal and visual presentation on the components of exercise prescription. Introduces F.I.T.T principle from ACSM for exercise prescriptions  Reviews F.I.T.T. principles of resistance exercise including progression. Written material given at graduation. ? ?  ?Education: Exercise & Equipment Safety: ?- Individual verbal instruction and demonstration of equipment use and safety with use of the equipment. ?Flowsheet Row Cardiac Rehab from 01/20/2022 in [facility] Cardiac and Pulmonary Rehab  ?Date 01/20/22  ?Educator AS  ?Instruction Review Code 1- Verbalizes Understanding  ? ?  ? ? ?Education: Exercise Physiology & General Exercise Guidelines: ?- Group verbal and written instruction with models to review the exercise physiology of the cardiovascular system and associated critical values. Provides general exercise guidelines with specific guidelines to those with heart or lung disease.  ?Flowsheet Row Cardiac Rehab from 01/20/2022 in [facility] Cardiac and Pulmonary Rehab  ?Education need identified 01/20/22  ? ?  ? ? ?Education: Flexibility, Balance, Mind/Body Relaxation: ?- Group verbal and visual presentation with interactive activity on the components of exercise prescription. Introduces F.I.T.T principle from ACSM for exercise prescriptions. Reviews F.I.T.T. principles of flexibility and balance exercise training including progression. Also discusses the mind body connection.  Reviews various relaxation techniques to help reduce and manage stress (i.e. Deep breathing, progressive muscle relaxation, and visualization). Balance handout provided to take home. Written material given at graduation. ? ? ?Activity Barriers & Risk Stratification: ? Activity Barriers & Cardiac Risk Stratification - 01/10/22 1304   ? ?  ? Activity Barriers & Cardiac Risk Stratification   ? Activity Barriers None   ? Cardiac Risk Stratification High   ? ?  ?  ? ?  ? ? ?6 Minute Walk: ? 6 Minute Walk   ? ? [place] Name 01/20/22 1604  ?  ?  ?  ? 6 Minute Walk  ? Distance 888 feet    ? Walk Time 6 minutes    ? # of Rest Breaks 0    ? MPH 1.7    ? METS 1.9    ? RPE 11    ? Perceived Dyspnea  0    ? VO2 Peak 6.63    ? Symptoms Yes (comment)    ? Comments leg fatigue (general)    ? Resting HR 82 bpm    ? Resting BP 104/54    ? Resting Oxygen Saturation  98 %    ? Exercise Oxygen Saturation  during 6 min walk 97 %    ? Max Ex. HR 103 bpm    ? Max Ex. BP 138/64    ? 2 Minute Post BP 124/60    ? ?  ?  ? ?  ? ? ?Oxygen Initial  Assessment: ? ? ?Oxygen Re-Evaluation: ? ? ?Oxygen Discharge (Final Oxygen Re-Evaluation): ? ? ?Initial Exercise Prescription: ? Initial Exercise Prescription - 01/20/22 1600   ? ?  ? Date of Initial Exercise RX and Referring Provider  ? Date 01/20/22   ? Referring Provider Nehemiah Massed   ?  ? Oxygen  ? Maintain Oxygen Saturation 88% or higher   ?  ? Treadmill  ? MPH 1.5   ? Grade 0   ? Minutes 15   ? METs 2   ?  ? NuStep  ? Level 2   ? SPM 80   ? Minutes 15   ? METs 1.9   ?  ? Recumbant Elliptical  ? Level 1   ? RPM 50   ? Minutes 15   ? METs 1.9   ?  ? REL-XR  ? Level 2   ? Speed 50   ? Minutes 15   ? METs 1.9   ?  ? Track  ? Laps 15   ? Minutes 15   ? METs 1.9   ?  ? Prescription Details  ? Frequency (times per week) 3   ? Duration Progress to 30 minutes of continuous aerobic without signs/symptoms of physical distress   ?  ? Intensity  ? THRR 40-80% of Max Heartrate 107-132   ? Ratings of Perceived Exertion 11-13   ? Perceived Dyspnea 0-4   ?  ? Progression  ? Progression Continue to progress workloads to maintain intensity without signs/symptoms of physical distress.   ?  ? Resistance Training  ? Training Prescription Yes   ? Weight 3 lb   ? Reps 10-15   ? ?  ?  ? ?  ? ? ?Perform Capillary Blood Glucose checks as needed. ? ?Exercise Prescription Changes: ? ? Exercise Prescription Changes    ? ? [place] Name 01/20/22 1600  ?  ?  ?  ?  ?  ? Response to Exercise  ? Blood Pressure (Admit) 104/54      ? Blood Pressure (Exercise) 138/64      ? Blood Pressure (Exit) 124/60      ? Heart Rate (Admit) 82 bpm

## 2022-01-22 ENCOUNTER — Encounter: Payer: Self-pay | Admitting: *Deleted

## 2022-01-22 DIAGNOSIS — Z951 Presence of aortocoronary bypass graft: Secondary | ICD-10-CM

## 2022-01-22 NOTE — Progress Notes (Signed)
Cardiac Individual Treatment Plan ? ?Patient Details  ?Name: Jesse Curtis ?MRN: [id number] ?Date of Birth: [birth year] ?Referring Provider:   ?Flowsheet Row Cardiac Rehab from 01/20/2022 in [facility] Cardiac and Pulmonary Rehab  ?Referring Provider Nehemiah Massed  ? ?  ? ? ?Initial Encounter Date:  ?Flowsheet Row Cardiac Rehab from 01/20/2022 in [facility] Cardiac and Pulmonary Rehab  ?Date 01/20/22  ? ?  ? ? ?Visit Diagnosis: S/P CABG x 4 ? ?Patient's Home Medications on Admission: ? ?Current Outpatient Medications:  ?  acetaminophen (TYLENOL) 650 MG CR tablet, Take 1,300 mg by mouth every 8 (eight) hours as needed for pain., Disp: , Rfl:  ?  amiodarone (PACERONE) 200 MG tablet, Take by mouth., Disp: , Rfl:  ?  Ascorbic Acid (VITAMIN C PO), Take 1,500 mg by mouth daily., Disp: , Rfl:  ?  aspirin 81 MG EC tablet, Take by mouth., Disp: , Rfl:  ?  atorvastatin (LIPITOR) 40 MG tablet, Take 40 mg by mouth daily., Disp: , Rfl:  ?  Cholecalciferol (VITAMIN D3) 125 MCG (5000 UT) CAPS, Take 5,000 Units by mouth daily., Disp: , Rfl:  ?  cyanocobalamin 1000 MCG tablet, Take 1 tablet by mouth daily., Disp: , Rfl:  ?  ferrous sulfate 325 (65 FE) MG tablet, Take 325 mg by mouth daily with breakfast., Disp: , Rfl:  ?  finasteride (PROSCAR) 5 MG tablet, Take 5 mg by mouth daily., Disp: , Rfl:  ?  glimepiride (AMARYL) 4 MG tablet, Take 4 mg by mouth daily with breakfast. (Patient not taking: Reported on 01/10/2022), Disp: , Rfl:  ?  insulin isophane & regular human KwikPen (NOVOLIN 70/30 KWIKPEN) (70-30) 100 UNIT/ML KwikPen, INJECT 28 UNITS SUBCUTANEOUSLY IN THE MORNING AND 20 UNITS AT NIGHT, Disp: , Rfl:  ?  lisinopril (ZESTRIL) 5 MG tablet, Take 5 mg by mouth daily. (Patient not taking: Reported on 01/10/2022), Disp: , Rfl:  ?  metFORMIN (GLUCOPHAGE) 500 MG tablet, Take 1,000 mg by mouth 2 (two) times daily., Disp: , Rfl:  ?  metoprolol tartrate (LOPRESSOR) 25 MG tablet, Take by mouth., Disp: , Rfl:  ?  Multiple Vitamin (MULTIVITAMIN WITH MINERALS)  TABS tablet, Take 1 tablet by mouth daily., Disp: , Rfl:  ?  Multiple Vitamins-Minerals (EYE VITAMINS & MINERALS PO), Take 1 tablet by mouth daily., Disp: , Rfl:  ?  Omega-3 Fatty Acids (FISH OIL) 1000 MG CAPS, Take 1,000 mg by mouth daily., Disp: , Rfl:  ?  omeprazole (PRILOSEC OTC) 20 MG tablet, Take 20 mg by mouth every other day. In the morning., Disp: , Rfl:  ?  pioglitazone (ACTOS) 30 MG tablet, Take 30 mg by mouth daily. (Patient not taking: Reported on 01/10/2022), Disp: , Rfl:  ?  Semaglutide,0.25 or 0.'5MG'$ /DOS, 2 MG/1.5ML SOPN, Inject into the skin., Disp: , Rfl:  ?  vitamin B-12 (CYANOCOBALAMIN) 1000 MCG tablet, Take 1,000 mcg by mouth daily. (Patient not taking: Reported on 01/10/2022), Disp: , Rfl:  ?  zinc gluconate 50 MG tablet, Take 50 mg by mouth daily. (Patient not taking: Reported on 01/10/2022), Disp: , Rfl:  ? ?Past Medical History: ?Past Medical History:  ?Diagnosis Date  ? 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 Diabetes mellitus without complication ([place])   ? Fatigue   ? POSSIBLE SEIZURE  NO EVAL  NO FUTHER PROBLEM  ? GERD (gastroesophageal reflux disease)   ? ULCERS  ? Hypertension   ? ? ?Tobacco Use: ?Social History  ? ?Tobacco Use  ?Smoking Status Never  ?  Smokeless Tobacco Never  ? ? ?Labs: ?Review Flowsheet   ? ?    ? View : No data to display.  ?  ?  ?  ?  ?  ? ? ? ?Exercise Target Goals: ?Exercise Program Goal: ?Individual exercise prescription set using results from initial 6 min walk test and THRR while considering  patient?s activity barriers and safety.  ? ?Exercise Prescription Goal: ?Initial exercise prescription builds to 30-45 minutes a day of aerobic activity, 2-3 days per week.  Home exercise guidelines will be given to patient during program as part of exercise prescription that the participant will acknowledge. ? ? ?Education: Aerobic Exercise: ?- Group verbal and visual presentation on the components of exercise prescription. Introduces F.I.T.T principle from ACSM for exercise  prescriptions.  Reviews F.I.T.T. principles of aerobic exercise including progression. Written material given at graduation. ? ? ?Education: Resistance Exercise: ?- Group verbal and visual presentation on the components of exercise prescription. Introduces F.I.T.T principle from ACSM for exercise prescriptions  Reviews F.I.T.T. principles of resistance exercise including progression. Written material given at graduation. ? ?  ?Education: Exercise & Equipment Safety: ?- Individual verbal instruction and demonstration of equipment use and safety with use of the equipment. ?Flowsheet Row Cardiac Rehab from 01/20/2022 in [facility] Cardiac and Pulmonary Rehab  ?Date 01/20/22  ?Educator AS  ?Instruction Review Code 1- Verbalizes Understanding  ? ?  ? ? ?Education: Exercise Physiology & General Exercise Guidelines: ?- Group verbal and written instruction with models to review the exercise physiology of the cardiovascular system and associated critical values. Provides general exercise guidelines with specific guidelines to those with heart or lung disease.  ?Flowsheet Row Cardiac Rehab from 01/20/2022 in [facility] Cardiac and Pulmonary Rehab  ?Education need identified 01/20/22  ? ?  ? ? ?Education: Flexibility, Balance, Mind/Body Relaxation: ?- Group verbal and visual presentation with interactive activity on the components of exercise prescription. Introduces F.I.T.T principle from ACSM for exercise prescriptions. Reviews F.I.T.T. principles of flexibility and balance exercise training including progression. Also discusses the mind body connection.  Reviews various relaxation techniques to help reduce and manage stress (i.e. Deep breathing, progressive muscle relaxation, and visualization). Balance handout provided to take home. Written material given at graduation. ? ? ?Activity Barriers & Risk Stratification: ? Activity Barriers & Cardiac Risk Stratification - 01/10/22 1304   ? ?  ? Activity Barriers & Cardiac Risk Stratification   ? Activity Barriers None   ? Cardiac Risk Stratification High   ? ?  ?  ? ?  ? ? ?6 Minute Walk: ? 6 Minute Walk   ? ? Jesse Curtis Name 01/20/22 1604  ?  ?  ?  ? 6 Minute Walk  ? Distance 888 feet    ? Walk Time 6 minutes    ? # of Rest Breaks 0    ? MPH 1.7    ? METS 1.9    ? RPE 11    ? Perceived Dyspnea  0    ? VO2 Peak 6.63    ? Symptoms Yes (comment)    ? Comments leg fatigue (general)    ? Resting HR 82 bpm    ? Resting BP 104/54    ? Resting Oxygen Saturation  98 %    ? Exercise Oxygen Saturation  during 6 min walk 97 %    ? Max Ex. HR 103 bpm    ? Max Ex. BP 138/64    ? 2 Minute Post BP 124/60    ? ?  ?  ? ?  ? ? ?  Oxygen Initial Assessment: ? ? ?Oxygen Re-Evaluation: ? ? ?Oxygen Discharge (Final Oxygen Re-Evaluation): ? ? ?Initial Exercise Prescription: ? Initial Exercise Prescription - 01/20/22 1600   ? ?  ? Date of Initial Exercise RX and Referring Provider  ? Date 01/20/22   ? Referring Provider Nehemiah Massed   ?  ? Oxygen  ? Maintain Oxygen Saturation 88% or higher   ?  ? Treadmill  ? MPH 1.5   ? Grade 0   ? Minutes 15   ? METs 2   ?  ? NuStep  ? Level 2   ? SPM 80   ? Minutes 15   ? METs 1.9   ?  ? Recumbant Elliptical  ? Level 1   ? RPM 50   ? Minutes 15   ? METs 1.9   ?  ? REL-XR  ? Level 2   ? Speed 50   ? Minutes 15   ? METs 1.9   ?  ? Track  ? Laps 15   ? Minutes 15   ? METs 1.9   ?  ? Prescription Details  ? Frequency (times per week) 3   ? Duration Progress to 30 minutes of continuous aerobic without signs/symptoms of physical distress   ?  ? Intensity  ? THRR 40-80% of Max Heartrate 107-132   ? Ratings of Perceived Exertion 11-13   ? Perceived Dyspnea 0-4   ?  ? Progression  ? Progression Continue to progress workloads to maintain intensity without signs/symptoms of physical distress.   ?  ? Resistance Training  ? Training Prescription Yes   ? Weight 3 lb   ? Reps 10-15   ? ?  ?  ? ?  ? ? ?Perform Capillary Blood Glucose checks as needed. ? ?Exercise Prescription Changes: ? ? Exercise Prescription Changes    ? ? [place] Name 01/20/22 1600  ?  ?  ?  ?  ?  ? Response to Exercise  ? Blood Pressure (Admit) 104/54      ? Blood Pressure (Exercise) 138/64      ? Blood Pressure (Exit) 124/60      ? Heart Rate (Admit) 82 bpm

## 2022-01-24 DIAGNOSIS — Z9989 Dependence on other enabling machines and devices: Secondary | ICD-10-CM | POA: Diagnosis not present

## 2022-01-24 DIAGNOSIS — Z794 Long term (current) use of insulin: Secondary | ICD-10-CM | POA: Diagnosis not present

## 2022-01-24 DIAGNOSIS — Z6830 Body mass index (BMI) 30.0-30.9, adult: Secondary | ICD-10-CM | POA: Diagnosis not present

## 2022-01-24 DIAGNOSIS — I10 Essential (primary) hypertension: Secondary | ICD-10-CM | POA: Diagnosis not present

## 2022-01-24 DIAGNOSIS — U071 COVID-19: Secondary | ICD-10-CM | POA: Diagnosis not present

## 2022-01-24 DIAGNOSIS — E1165 Type 2 diabetes mellitus with hyperglycemia: Secondary | ICD-10-CM | POA: Diagnosis not present

## 2022-01-24 DIAGNOSIS — G4733 Obstructive sleep apnea (adult) (pediatric): Secondary | ICD-10-CM | POA: Diagnosis not present

## 2022-01-24 DIAGNOSIS — N401 Enlarged prostate with lower urinary tract symptoms: Secondary | ICD-10-CM | POA: Diagnosis not present

## 2022-01-27 ENCOUNTER — Other Ambulatory Visit: Payer: Self-pay

## 2022-01-27 DIAGNOSIS — Z79899 Other long term (current) drug therapy: Secondary | ICD-10-CM | POA: Diagnosis not present

## 2022-01-27 DIAGNOSIS — Z951 Presence of aortocoronary bypass graft: Secondary | ICD-10-CM

## 2022-01-27 LAB — GLUCOSE, CAPILLARY
Glucose-Capillary: 166 mg/dL — ABNORMAL HIGH (ref 70–99)
Glucose-Capillary: 118 mg/dL — ABNORMAL HIGH (ref 70–99)

## 2022-01-27 NOTE — Progress Notes (Signed)
Daily Session Note ? ?Patient Details  ?Name: Jesse Curtis ?MRN: [id number] ?Date of Birth: [birth year] ?Referring Provider:   ?Flowsheet Row Cardiac Rehab from 01/20/2022 in [facility] Cardiac and Pulmonary Rehab  ?Referring Provider Nehemiah Massed  ? ?  ? ? ?Encounter Date: 01/27/2022 ? ?Check In: ? Session Check In - 01/27/22 1613   ? ?  ? Check-In  ? Supervising physician immediately available to respond to emergencies See telemetry face sheet for immediately available ER MD   ? Location [facility]-Cardiac & Pulmonary Rehab   ? Staff Present Birdie Sons, MPA, Nino Glow, MS, ASCM CEP, Exercise Physiologist;Joseph [place], [place]   ? Virtual Visit No   ? Medication changes reported     No   ? Fall or balance concerns reported    No   ? Warm-up and Cool-down Performed on first and last piece of equipment   ? Resistance Training Performed Yes   ? VAD Patient? No   ? PAD/SET Patient? No   ?  ? Pain Assessment  ? Currently in Pain? No/denies   ? ?  ?  ? ?  ? ? ? ? ? ?Social History  ? ?Tobacco Use  ?Smoking Status Never  ?Smokeless Tobacco Never  ? ? ?Goals Met:  ?Independence with exercise equipment ?Exercise tolerated well ?No report of concerns or symptoms today ?Strength training completed today ? ?Goals Unmet:  ?Not Applicable ? ?Comments: First full day of exercise!  Patient was oriented to gym and equipment including functions, settings, policies, and procedures.  Patient's individual exercise prescription and treatment plan were reviewed.  All starting workloads were established based on the results of the 6 minute walk test done at initial orientation visit.  The plan for exercise progression was also introduced and progression will be customized based on patient's performance and goals. ? ? ? ? ?Dr. Emily Filbert is Medical Director for [place].  ?Dr. Ottie Glazier is Medical Director for [facility] Pulmonary Rehabilitation. ?

## 2022-01-29 DIAGNOSIS — Z79899 Other long term (current) drug therapy: Secondary | ICD-10-CM | POA: Diagnosis not present

## 2022-01-29 DIAGNOSIS — Z951 Presence of aortocoronary bypass graft: Secondary | ICD-10-CM

## 2022-01-29 LAB — GLUCOSE, CAPILLARY
Glucose-Capillary: 182 mg/dL — ABNORMAL HIGH (ref 70–99)
Glucose-Capillary: 127 mg/dL — ABNORMAL HIGH (ref 70–99)

## 2022-01-29 NOTE — Progress Notes (Signed)
Daily Session Note ? ?Patient Details  ?Name: Jesse Curtis ?MRN: [id number] ?Date of Birth: [birth year] ?Referring Provider:   ?Flowsheet Row Cardiac Rehab from 01/20/2022 in [facility] Cardiac and Pulmonary Rehab  ?Referring Provider Nehemiah Massed  ? ?  ? ? ?Encounter Date: 01/29/2022 ? ?Check In: ? Session Check In - 01/29/22 1601   ? ?  ? Check-In  ? Supervising physician immediately available to respond to emergencies See telemetry face sheet for immediately available ER MD   ? Location [facility]-Cardiac & Pulmonary Rehab   ? Staff Present Birdie Sons, MPA, RN;Joseph [place], RCP,RRT,BSRT;Kara [place], MS, ASCM CEP, Exercise Physiologist   ? Virtual Visit No   ? Medication changes reported     No   ? Fall or balance concerns reported    No   ? Warm-up and Cool-down Performed on first and last piece of equipment   ? Resistance Training Performed Yes   ? VAD Patient? No   ? PAD/SET Patient? No   ?  ? Pain Assessment  ? Currently in Pain? No/denies   ? ?  ?  ? ?  ? ? ? ? ? ?Social History  ? ?Tobacco Use  ?Smoking Status Never  ?Smokeless Tobacco Never  ? ? ?Goals Met:  ?Independence with exercise equipment ?Exercise tolerated well ?No report of concerns or symptoms today ?Strength training completed today ? ?Goals Unmet:  ?Not Applicable ? ?Comments: Pt able to follow exercise prescription today without complaint.  Will continue to monitor for progression. ? ? ? ?Dr. Emily Filbert is Medical Director for [place].  ?Dr. Ottie Glazier is Medical Director for [facility] Pulmonary Rehabilitation. ?

## 2022-01-30 ENCOUNTER — Encounter: Payer: Medicare HMO | Admitting: *Deleted

## 2022-01-30 DIAGNOSIS — Z951 Presence of aortocoronary bypass graft: Secondary | ICD-10-CM | POA: Diagnosis not present

## 2022-01-30 DIAGNOSIS — Z79899 Other long term (current) drug therapy: Secondary | ICD-10-CM | POA: Diagnosis not present

## 2022-01-30 LAB — GLUCOSE, CAPILLARY
Glucose-Capillary: 127 mg/dL — ABNORMAL HIGH (ref 70–99)
Glucose-Capillary: 148 mg/dL — ABNORMAL HIGH (ref 70–99)

## 2022-01-30 NOTE — Progress Notes (Signed)
Daily Session Note ? ?Patient Details  ?Name: Jesse Curtis ?MRN: [id number] ?Date of Birth: 1946-02-04 ?Referring Provider:   ?Flowsheet Row Cardiac Rehab from 01/20/2022 in [facility] Cardiac and Pulmonary Rehab  ?Referring Provider Nehemiah Massed  ? ?  ? ? ?Encounter Date: 01/30/2022 ? ?Check In: ? Session Check In - 01/30/22 1605   ? ?  ? Check-In  ? Supervising physician immediately available to respond to emergencies See telemetry face sheet for immediately available ER MD   ? Location [facility]-Cardiac & Pulmonary Rehab   ? Staff Present Renita Papa, RN BSN;Joseph [place], RCP,RRT,BSRT;Jessica [place], [place], [place], [place], CCET   ? Virtual Visit No   ? Medication changes reported     No   ? Fall or balance concerns reported    No   ? Warm-up and Cool-down Performed on first and last piece of equipment   ? Resistance Training Performed Yes   ? VAD Patient? No   ? PAD/SET Patient? No   ?  ? Pain Assessment  ? Currently in Pain? No/denies   ? ?  ?  ? ?  ? ? ? ? ? ?Social History  ? ?Tobacco Use  ?Smoking Status Never  ?Smokeless Tobacco Never  ? ? ?Goals Met:  ?Independence with exercise equipment ?Exercise tolerated well ?No report of concerns or symptoms today ?Strength training completed today ? ?Goals Unmet:  ?Not Applicable ? ?Comments: Pt able to follow exercise prescription today without complaint.  Will continue to monitor for progression. ? ? ? ?Dr. Emily Filbert is Medical Director for [place].  ?Dr. Ottie Glazier is Medical Director for [facility] Pulmonary Rehabilitation. ?

## 2022-01-31 DIAGNOSIS — E1165 Type 2 diabetes mellitus with hyperglycemia: Secondary | ICD-10-CM | POA: Diagnosis not present

## 2022-01-31 DIAGNOSIS — E892 Postprocedural hypoparathyroidism: Secondary | ICD-10-CM | POA: Diagnosis not present

## 2022-01-31 DIAGNOSIS — G4733 Obstructive sleep apnea (adult) (pediatric): Secondary | ICD-10-CM | POA: Diagnosis not present

## 2022-01-31 DIAGNOSIS — Z951 Presence of aortocoronary bypass graft: Secondary | ICD-10-CM | POA: Diagnosis not present

## 2022-01-31 DIAGNOSIS — C61 Malignant neoplasm of prostate: Secondary | ICD-10-CM | POA: Diagnosis not present

## 2022-01-31 DIAGNOSIS — Z794 Long term (current) use of insulin: Secondary | ICD-10-CM | POA: Diagnosis not present

## 2022-01-31 DIAGNOSIS — Z1389 Encounter for screening for other disorder: Secondary | ICD-10-CM | POA: Diagnosis not present

## 2022-01-31 DIAGNOSIS — Z0000 Encounter for general adult medical examination without abnormal findings: Secondary | ICD-10-CM | POA: Diagnosis not present

## 2022-01-31 DIAGNOSIS — I10 Essential (primary) hypertension: Secondary | ICD-10-CM | POA: Diagnosis not present

## 2022-01-31 DIAGNOSIS — E119 Type 2 diabetes mellitus without complications: Secondary | ICD-10-CM | POA: Diagnosis not present

## 2022-02-03 ENCOUNTER — Encounter: Payer: Medicare HMO | Attending: Internal Medicine

## 2022-02-03 DIAGNOSIS — Z48812 Encounter for surgical aftercare following surgery on the circulatory system: Secondary | ICD-10-CM | POA: Diagnosis not present

## 2022-02-03 DIAGNOSIS — Z951 Presence of aortocoronary bypass graft: Secondary | ICD-10-CM | POA: Diagnosis not present

## 2022-02-03 DIAGNOSIS — E7800 Pure hypercholesterolemia, unspecified: Secondary | ICD-10-CM | POA: Diagnosis not present

## 2022-02-03 DIAGNOSIS — E1165 Type 2 diabetes mellitus with hyperglycemia: Secondary | ICD-10-CM | POA: Diagnosis not present

## 2022-02-03 DIAGNOSIS — I10 Essential (primary) hypertension: Secondary | ICD-10-CM | POA: Diagnosis not present

## 2022-02-03 DIAGNOSIS — I2510 Atherosclerotic heart disease of native coronary artery without angina pectoris: Secondary | ICD-10-CM | POA: Diagnosis not present

## 2022-02-03 NOTE — Progress Notes (Signed)
Daily Session Note ? ?Patient Details  ?Name: Jesse Curtis ?MRN: [id number] ?Date of Birth: 1946-02-19 ?Referring Provider:   ?Flowsheet Row Cardiac Rehab from 01/20/2022 in [facility] Cardiac and Pulmonary Rehab  ?Referring Provider Nehemiah Massed  ? ?  ? ? ?Encounter Date: 02/03/2022 ? ?Check In: ? Session Check In - 02/03/22 1600   ? ?  ? Check-In  ? Supervising physician immediately available to respond to emergencies See telemetry face sheet for immediately available ER MD   ? Location [facility]-Cardiac & Pulmonary Rehab   ? Staff Present Birdie Sons, MPA, Nino Glow, MS, ASCM CEP, Exercise Physiologist;Melissa [place], RDN, LDN;Joseph [place], RCP,RRT,BSRT   ? Virtual Visit No   ? Medication changes reported     No   ? Fall or balance concerns reported    No   ? Warm-up and Cool-down Performed on first and last piece of equipment   ? Resistance Training Performed Yes   ? VAD Patient? No   ? PAD/SET Patient? No   ?  ? Pain Assessment  ? Currently in Pain? No/denies   ? ?  ?  ? ?  ? ? ? ? ? ?Social History  ? ?Tobacco Use  ?Smoking Status Never  ?Smokeless Tobacco Never  ? ? ?Goals Met:  ?Independence with exercise equipment ?Exercise tolerated well ?No report of concerns or symptoms today ?Strength training completed today ? ?Goals Unmet:  ?Not Applicable ? ?Comments: Pt able to follow exercise prescription today without complaint.  Will continue to monitor for progression. ? ? ? ?Dr. Emily Filbert is Medical Director for [place].  ?Dr. Ottie Glazier is Medical Director for [facility] Pulmonary Rehabilitation. ?

## 2022-02-05 DIAGNOSIS — Z951 Presence of aortocoronary bypass graft: Secondary | ICD-10-CM

## 2022-02-05 DIAGNOSIS — Z48812 Encounter for surgical aftercare following surgery on the circulatory system: Secondary | ICD-10-CM | POA: Diagnosis not present

## 2022-02-05 NOTE — Progress Notes (Signed)
Daily Session Note ? ?Patient Details  ?Name: Jesse Curtis ?MRN: [id number] ?Date of Birth: 1946-07-04 ?Referring Provider:   ?Flowsheet Row Cardiac Rehab from 01/20/2022 in [facility] Cardiac and Pulmonary Rehab  ?Referring Provider Nehemiah Massed  ? ?  ? ? ?Encounter Date: 02/05/2022 ? ?Check In: ? Session Check In - 02/05/22 1616   ? ?  ? Check-In  ? Supervising physician immediately available to respond to emergencies See telemetry face sheet for immediately available ER MD   ? Location [facility]-Cardiac & Pulmonary Rehab   ? Staff Present Birdie Sons, MPA, Nino Glow, MS, ASCM CEP, Exercise Physiologist;Melissa [place], RDN, LDN;Joseph [place], RCP,RRT,BSRT   ? Virtual Visit No   ? Medication changes reported     No   ? Fall or balance concerns reported    No   ? Warm-up and Cool-down Performed on first and last piece of equipment   ? Resistance Training Performed Yes   ? VAD Patient? No   ? PAD/SET Patient? No   ?  ? Pain Assessment  ? Currently in Pain? No/denies   ? ?  ?  ? ?  ? ? ? ? ? ?Social History  ? ?Tobacco Use  ?Smoking Status Never  ?Smokeless Tobacco Never  ? ? ?Goals Met:  ?Independence with exercise equipment ?Exercise tolerated well ?No report of concerns or symptoms today ?Strength training completed today ? ?Goals Unmet:  ?Not Applicable ? ?Comments: Pt able to follow exercise prescription today without complaint.  Will continue to monitor for progression. ? ? ? ?Dr. Emily Filbert is Medical Director for [place].  ?Dr. Ottie Glazier is Medical Director for [facility] Pulmonary Rehabilitation. ?

## 2022-02-06 DIAGNOSIS — E7800 Pure hypercholesterolemia, unspecified: Secondary | ICD-10-CM | POA: Diagnosis not present

## 2022-02-06 DIAGNOSIS — Z794 Long term (current) use of insulin: Secondary | ICD-10-CM | POA: Diagnosis not present

## 2022-02-06 DIAGNOSIS — I10 Essential (primary) hypertension: Secondary | ICD-10-CM | POA: Diagnosis not present

## 2022-02-06 DIAGNOSIS — E1165 Type 2 diabetes mellitus with hyperglycemia: Secondary | ICD-10-CM | POA: Diagnosis not present

## 2022-02-06 DIAGNOSIS — I25810 Atherosclerosis of coronary artery bypass graft(s) without angina pectoris: Secondary | ICD-10-CM | POA: Diagnosis not present

## 2022-02-10 DIAGNOSIS — Z951 Presence of aortocoronary bypass graft: Secondary | ICD-10-CM | POA: Diagnosis not present

## 2022-02-10 DIAGNOSIS — Z48812 Encounter for surgical aftercare following surgery on the circulatory system: Secondary | ICD-10-CM | POA: Diagnosis not present

## 2022-02-10 NOTE — Progress Notes (Signed)
Daily Session Note ? ?Patient Details  ?Name: Jesse Curtis ?MRN: [id number] ?Date of Birth: 10/05/1946 ?Referring Provider:   ?Flowsheet Row Cardiac Rehab from 01/20/2022 in [facility] Cardiac and Pulmonary Rehab  ?Referring Provider Nehemiah Massed  ? ?  ? ? ?Encounter Date: 02/10/2022 ? ?Check In: ? Session Check In - 02/10/22 1556   ? ?  ? Check-In  ? Supervising physician immediately available to respond to emergencies See telemetry face sheet for immediately available ER MD   ? Location [facility]-Cardiac & Pulmonary Rehab   ? Staff Present Birdie Sons, MPA, RN;Joseph [place], RCP,RRT,BSRT;Austin Herd [place], BS, ACSM CEP, Exercise Physiologist   ? Virtual Visit No   ? Medication changes reported     No   ? Fall or balance concerns reported    No   ? Warm-up and Cool-down Performed on first and last piece of equipment   ? Resistance Training Performed Yes   ? VAD Patient? No   ? PAD/SET Patient? No   ?  ? Pain Assessment  ? Currently in Pain? No/denies   ? ?  ?  ? ?  ? ? ? ? ? ?Social History  ? ?Tobacco Use  ?Smoking Status Never  ?Smokeless Tobacco Never  ? ? ?Goals Met:  ?Independence with exercise equipment ?Exercise tolerated well ?No report of concerns or symptoms today ?Strength training completed today ? ?Goals Unmet:  ?Not Applicable ? ?Comments: Pt able to follow exercise prescription today without complaint.  Will continue to monitor for progression. ? ? ? ?Dr. Emily Filbert is Medical Director for [place].  ?Dr. Ottie Glazier is Medical Director for [facility] Pulmonary Rehabilitation. ?

## 2022-02-12 ENCOUNTER — Encounter: Payer: Medicare HMO | Admitting: *Deleted

## 2022-02-12 DIAGNOSIS — Z48812 Encounter for surgical aftercare following surgery on the circulatory system: Secondary | ICD-10-CM | POA: Diagnosis not present

## 2022-02-12 DIAGNOSIS — Z951 Presence of aortocoronary bypass graft: Secondary | ICD-10-CM

## 2022-02-12 NOTE — Progress Notes (Signed)
Daily Session Note ? ?Patient Details  ?Name: Jesse Curtis ?MRN: [id number] ?Date of Birth: Nov 27, 1945 ?Referring Provider:   ?Flowsheet Row Cardiac Rehab from 01/20/2022 in [facility] Cardiac and Pulmonary Rehab  ?Referring Provider Nehemiah Massed  ? ?  ? ? ?Encounter Date: 02/12/2022 ? ?Check In: ? Session Check In - 02/12/22 1835   ? ?  ? Check-In  ? Supervising physician immediately available to respond to emergencies See telemetry face sheet for immediately available ER MD   ? Location [facility]-Cardiac & Pulmonary Rehab   ? Staff Present Nyoka Cowden, RN, BSN, Tyna Jaksch, MS, ASCM CEP, Exercise Physiologist;Melissa [place], RDN, LDN   ? Virtual Visit No   ? Medication changes reported     No   ? Fall or balance concerns reported    No   ? Warm-up and Cool-down Performed on first and last piece of equipment   ? Resistance Training Performed Yes   ? VAD Patient? No   ? PAD/SET Patient? No   ?  ? Pain Assessment  ? Currently in Pain? No/denies   ? ?  ?  ? ?  ? ? ? ? ? ?Social History  ? ?Tobacco Use  ?Smoking Status Never  ?Smokeless Tobacco Never  ? ? ?Goals Met:  ?Independence with exercise equipment ?Exercise tolerated well ?No report of concerns or symptoms today ?Strength training completed today ? ?Goals Unmet:  ?Not Applicable ? ?Comments: Pt able to follow exercise prescription today without complaint.  Will continue to monitor for progression. ? ? ? ?Dr. Emily Filbert is Medical Director for [place].  ?Dr. Ottie Glazier is Medical Director for [facility] Pulmonary Rehabilitation. ?

## 2022-02-13 ENCOUNTER — Encounter: Payer: Medicare HMO | Admitting: *Deleted

## 2022-02-13 DIAGNOSIS — Z951 Presence of aortocoronary bypass graft: Secondary | ICD-10-CM

## 2022-02-13 DIAGNOSIS — Z48812 Encounter for surgical aftercare following surgery on the circulatory system: Secondary | ICD-10-CM | POA: Diagnosis not present

## 2022-02-13 NOTE — Progress Notes (Signed)
Daily Session Note ? ?Patient Details  ?Name: Jesse Curtis ?MRN: [id number] ?Date of Birth: [birth year] ?Referring Provider:   ?Flowsheet Row Cardiac Rehab from 01/20/2022 in [facility] Cardiac and Pulmonary Rehab  ?Referring Provider Nehemiah Massed  ? ?  ? ? ?Encounter Date: 02/13/2022 ? ?Check In: ? Session Check In - 02/13/22 1604   ? ?  ? Check-In  ? Supervising physician immediately available to respond to emergencies See telemetry face sheet for immediately available ER MD   ? Location [facility]-Cardiac & Pulmonary Rehab   ? Staff Present Renita Papa, RN BSN;Joseph [place], RCP,RRT,BSRT;Laureen [place], [place], RRT, CPFT   ? Virtual Visit No   ? Medication changes reported     No   ? Fall or balance concerns reported    No   ? Warm-up and Cool-down Performed on first and last piece of equipment   ? Resistance Training Performed Yes   ? VAD Patient? No   ? PAD/SET Patient? No   ?  ? Pain Assessment  ? Currently in Pain? No/denies   ? ?  ?  ? ?  ? ? ? ? ? ?Social History  ? ?Tobacco Use  ?Smoking Status Never  ?Smokeless Tobacco Never  ? ? ?Goals Met:  ?Independence with exercise equipment ?Exercise tolerated well ?No report of concerns or symptoms today ?Strength training completed today ? ?Goals Unmet:  ?Not Applicable ? ?Comments: Pt able to follow exercise prescription today without complaint.  Will continue to monitor for progression. ? ? ? ?Dr. Emily Filbert is Medical Director for [place].  ?Dr. Ottie Glazier is Medical Director for [facility] Pulmonary Rehabilitation. ?

## 2022-02-17 DIAGNOSIS — Z48812 Encounter for surgical aftercare following surgery on the circulatory system: Secondary | ICD-10-CM | POA: Diagnosis not present

## 2022-02-17 DIAGNOSIS — Z951 Presence of aortocoronary bypass graft: Secondary | ICD-10-CM | POA: Diagnosis not present

## 2022-02-17 NOTE — Progress Notes (Signed)
Daily Session Note ? ?Patient Details  ?Name: Jesse Curtis ?MRN: [id number] ?Date of Birth: 04/02/1946 ?Referring Provider:   ?Flowsheet Row Cardiac Rehab from 01/20/2022 in [facility] Cardiac and Pulmonary Rehab  ?Referring Provider Nehemiah Massed  ? ?  ? ? ?Encounter Date: 02/17/2022 ? ?Check In: ? Session Check In - 02/17/22 1621   ? ?  ? Check-In  ? Supervising physician immediately available to respond to emergencies See telemetry face sheet for immediately available ER MD   ? Location [facility]-Cardiac & Pulmonary Rehab   ? Staff Present Birdie Sons, MPA, RN;Amanda Sommer, BA, ACSM CEP, Exercise Physiologist;Joseph [place], [place]   ? Virtual Visit No   ? Medication changes reported     No   ? Fall or balance concerns reported    No   ? Warm-up and Cool-down Performed on first and last piece of equipment   ? Resistance Training Performed No   ? VAD Patient? No   ? PAD/SET Patient? No   ?  ? Pain Assessment  ? Currently in Pain? No/denies   ? ?  ?  ? ?  ? ? ? ? ? ?Social History  ? ?Tobacco Use  ?Smoking Status Never  ?Smokeless Tobacco Never  ? ? ?Goals Met:  ?Independence with exercise equipment ?Exercise tolerated well ?No report of concerns or symptoms today ?Strength training completed today ? ?Goals Unmet:  ?Not Applicable. ? ?Comments: Pt able to follow exercise prescription today without complaint.  Will continue to monitor for progression. ? ? ? ? ?Dr. Emily Filbert is Medical Director for [place].  ?Dr. Ottie Glazier is Medical Director for [facility] Pulmonary Rehabilitation. ?

## 2022-02-18 DIAGNOSIS — I10 Essential (primary) hypertension: Secondary | ICD-10-CM | POA: Diagnosis not present

## 2022-02-18 DIAGNOSIS — E1165 Type 2 diabetes mellitus with hyperglycemia: Secondary | ICD-10-CM | POA: Diagnosis not present

## 2022-02-18 DIAGNOSIS — E7800 Pure hypercholesterolemia, unspecified: Secondary | ICD-10-CM | POA: Diagnosis not present

## 2022-02-18 DIAGNOSIS — I2510 Atherosclerotic heart disease of native coronary artery without angina pectoris: Secondary | ICD-10-CM | POA: Diagnosis not present

## 2022-02-19 ENCOUNTER — Encounter: Payer: Self-pay | Admitting: *Deleted

## 2022-02-19 DIAGNOSIS — Z951 Presence of aortocoronary bypass graft: Secondary | ICD-10-CM

## 2022-02-19 DIAGNOSIS — Z48812 Encounter for surgical aftercare following surgery on the circulatory system: Secondary | ICD-10-CM | POA: Diagnosis not present

## 2022-02-19 NOTE — Progress Notes (Signed)
Cardiac Individual Treatment Plan ? ?Patient Details  ?Name: Jesse Curtis ?MRN: [id number] ?Date of Birth: Jan 16, 1946 ?Referring Provider:   ?Flowsheet Row Cardiac Rehab from 01/20/2022 in [facility] Cardiac and Pulmonary Rehab  ?Referring Provider Nehemiah Massed  ? ?  ? ? ?Initial Encounter Date:  ?Flowsheet Row Cardiac Rehab from 01/20/2022 in [facility] Cardiac and Pulmonary Rehab  ?Date 01/20/22  ? ?  ? ? ?Visit Diagnosis: S/P CABG x 4 ? ?Patient's Home Medications on Admission: ? ?Current Outpatient Medications:  ?  acetaminophen (TYLENOL) 650 MG CR tablet, Take 1,300 mg by mouth every 8 (eight) hours as needed for pain., Disp: , Rfl:  ?  Ascorbic Acid (VITAMIN C PO), Take 1,500 mg by mouth daily., Disp: , Rfl:  ?  aspirin 81 MG EC tablet, Take by mouth., Disp: , Rfl:  ?  atorvastatin (LIPITOR) 40 MG tablet, Take 40 mg by mouth daily., Disp: , Rfl:  ?  Cholecalciferol (VITAMIN D3) 125 MCG (5000 UT) CAPS, Take 5,000 Units by mouth daily., Disp: , Rfl:  ?  cyanocobalamin 1000 MCG tablet, Take 1 tablet by mouth daily., Disp: , Rfl:  ?  ferrous sulfate 325 (65 FE) MG tablet, Take 325 mg by mouth daily with breakfast., Disp: , Rfl:  ?  finasteride (PROSCAR) 5 MG tablet, Take 5 mg by mouth daily., Disp: , Rfl:  ?  glimepiride (AMARYL) 4 MG tablet, Take 4 mg by mouth daily with breakfast. (Patient not taking: Reported on 01/10/2022), Disp: , Rfl:  ?  insulin isophane & regular human KwikPen (NOVOLIN 70/30 KWIKPEN) (70-30) 100 UNIT/ML KwikPen, INJECT 28 UNITS SUBCUTANEOUSLY IN THE MORNING AND 20 UNITS AT NIGHT, Disp: , Rfl:  ?  lisinopril (ZESTRIL) 5 MG tablet, Take 5 mg by mouth daily. (Patient not taking: Reported on 01/10/2022), Disp: , Rfl:  ?  metFORMIN (GLUCOPHAGE) 500 MG tablet, Take 1,000 mg by mouth 2 (two) times daily., Disp: , Rfl:  ?  metoprolol tartrate (LOPRESSOR) 25 MG tablet, Take by mouth., Disp: , Rfl:  ?  Multiple Vitamin (MULTIVITAMIN WITH MINERALS) TABS tablet, Take 1 tablet by mouth daily., Disp: , Rfl:  ?  Multiple  Vitamins-Minerals (EYE VITAMINS & MINERALS PO), Take 1 tablet by mouth daily., Disp: , Rfl:  ?  Omega-3 Fatty Acids (FISH OIL) 1000 MG CAPS, Take 1,000 mg by mouth daily., Disp: , Rfl:  ?  omeprazole (PRILOSEC OTC) 20 MG tablet, Take 20 mg by mouth every other day. In the morning., Disp: , Rfl:  ?  pioglitazone (ACTOS) 30 MG tablet, Take 30 mg by mouth daily. (Patient not taking: Reported on 01/10/2022), Disp: , Rfl:  ?  vitamin B-12 (CYANOCOBALAMIN) 1000 MCG tablet, Take 1,000 mcg by mouth daily. (Patient not taking: Reported on 01/10/2022), Disp: , Rfl:  ?  zinc gluconate 50 MG tablet, Take 50 mg by mouth daily. (Patient not taking: Reported on 01/10/2022), Disp: , Rfl:  ? ?Past Medical History: ?Past Medical History:  ?Diagnosis Date  ? 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 Diabetes mellitus without complication ([place])   ? Fatigue   ? POSSIBLE SEIZURE  NO EVAL  NO FUTHER PROBLEM  ? GERD (gastroesophageal reflux disease)   ? ULCERS  ? Hypertension   ? ? ?Tobacco Use: ?Social History  ? ?Tobacco Use  ?Smoking Status Never  ?Smokeless Tobacco Never  ? ? ?Labs: ?Review Flowsheet   ? ?    ? View : No data to display.  ?  ?  ?  ?  ?  ? ? ? ?  Exercise Target Goals: ?Exercise Program Goal: ?Individual exercise prescription set using results from initial 6 min walk test and THRR while considering  patient?s activity barriers and safety.  ? ?Exercise Prescription Goal: ?Initial exercise prescription builds to 30-45 minutes a day of aerobic activity, 2-3 days per week.  Home exercise guidelines will be given to patient during program as part of exercise prescription that the participant will acknowledge. ? ? ?Education: Aerobic Exercise: ?- Group verbal and visual presentation on the components of exercise prescription. Introduces F.I.T.T principle from ACSM for exercise prescriptions.  Reviews F.I.T.T. principles of aerobic exercise including progression. Written material given at graduation. ? ? ?Education: Resistance Exercise: ?-  Group verbal and visual presentation on the components of exercise prescription. Introduces F.I.T.T principle from ACSM for exercise prescriptions  Reviews F.I.T.T. principles of resistance exercise including progression. Written material given at graduation. ? ?  ?Education: Exercise & Equipment Safety: ?- Individual verbal instruction and demonstration of equipment use and safety with use of the equipment. ?Flowsheet Row Cardiac Rehab from 02/12/2022 in [facility] Cardiac and Pulmonary Rehab  ?Date 01/20/22  ?Educator AS  ?Instruction Review Code 1- Verbalizes Understanding  ? ?  ? ? ?Education: Exercise Physiology & General Exercise Guidelines: ?- Group verbal and written instruction with models to review the exercise physiology of the cardiovascular system and associated critical values. Provides general exercise guidelines with specific guidelines to those with heart or lung disease.  ?Flowsheet Row Cardiac Rehab from 02/12/2022 in [facility] Cardiac and Pulmonary Rehab  ?Education need identified 01/20/22  ? ?  ? ? ?Education: Flexibility, Balance, Mind/Body Relaxation: ?- Group verbal and visual presentation with interactive activity on the components of exercise prescription. Introduces F.I.T.T principle from ACSM for exercise prescriptions. Reviews F.I.T.T. principles of flexibility and balance exercise training including progression. Also discusses the mind body connection.  Reviews various relaxation techniques to help reduce and manage stress (i.e. Deep breathing, progressive muscle relaxation, and visualization). Balance handout provided to take home. Written material given at graduation. ? ? ?Activity Barriers & Risk Stratification: ? Activity Barriers & Cardiac Risk Stratification - 01/10/22 1304   ? ?  ? Activity Barriers & Cardiac Risk Stratification  ? Activity Barriers None   ? Cardiac Risk Stratification High   ? ?  ?  ? ?  ? ? ?6 Minute Walk: ? 6 Minute Walk   ? ? [place] Name 01/20/22 1604  ?  ?  ?  ? 6 Minute  Walk  ? Distance 888 feet    ? Walk Time 6 minutes    ? # of Rest Breaks 0    ? MPH 1.7    ? METS 1.9    ? RPE 11    ? Perceived Dyspnea  0    ? VO2 Peak 6.63    ? Symptoms Yes (comment)    ? Comments leg fatigue (general)    ? Resting HR 82 bpm    ? Resting BP 104/54    ? Resting Oxygen Saturation  98 %    ? Exercise Oxygen Saturation  during 6 min walk 97 %    ? Max Ex. HR 103 bpm    ? Max Ex. BP 138/64    ? 2 Minute Post BP 124/60    ? ?  ?  ? ?  ? ? ?Oxygen Initial Assessment: ? ? ?Oxygen Re-Evaluation: ? ? ?Oxygen Discharge (Final Oxygen Re-Evaluation): ? ? ?Initial Exercise Prescription: ? Initial Exercise Prescription - 01/20/22 1600   ? ?  ?  Date of Initial Exercise RX and Referring Provider  ? Date 01/20/22   ? Referring Provider Nehemiah Massed   ?  ? Oxygen  ? Maintain Oxygen Saturation 88% or higher   ?  ? Treadmill  ? MPH 1.5   ? Grade 0   ? Minutes 15   ? METs 2   ?  ? NuStep  ? Level 2   ? SPM 80   ? Minutes 15   ? METs 1.9   ?  ? Recumbant Elliptical  ? Level 1   ? RPM 50   ? Minutes 15   ? METs 1.9   ?  ? REL-XR  ? Level 2   ? Speed 50   ? Minutes 15   ? METs 1.9   ?  ? Track  ? Laps 15   ? Minutes 15   ? METs 1.9   ?  ? Prescription Details  ? Frequency (times per week) 3   ? Duration Progress to 30 minutes of continuous aerobic without signs/symptoms of physical distress   ?  ? Intensity  ? THRR 40-80% of Max Heartrate 107-132   ? Ratings of Perceived Exertion 11-13   ? Perceived Dyspnea 0-4   ?  ? Progression  ? Progression Continue to progress workloads to maintain intensity without signs/symptoms of physical distress.   ?  ? Resistance Training  ? Training Prescription Yes   ? Weight 3 lb   ? Reps 10-15   ? ?  ?  ? ?  ? ? ?Perform Capillary Blood Glucose checks as needed. ? ?Exercise Prescription Changes: ? ? Exercise Prescription Changes   ? ? [place] Name 01/20/22 1600 02/03/22 1800 02/18/22 1200  ?  ?  ?  ? Response to Exercise  ? Blood Pressure (Admit) 104/54 128/62 124/62    ? Blood Pressure  (Exercise) 138/64 124/62 144/76    ? Blood Pressure (Exit) 124/60 116/60 118/60    ? Heart Rate (Admit) 82 bpm 95 bpm 59 bpm    ? Heart Rate (Exercise) 103 bpm 112 bpm 114 bpm    ? Heart Rate (Exit) 89 bpm 102 bpm

## 2022-02-19 NOTE — Progress Notes (Signed)
Daily Session Note ? ?Patient Details  ?Name: Jesse Curtis ?MRN: [id number] ?Date of Birth: November 15, 1945 ?Referring Provider:   ?Flowsheet Row Cardiac Rehab from 01/20/2022 in [facility] Cardiac and Pulmonary Rehab  ?Referring Provider Nehemiah Massed  ? ?  ? ? ?Encounter Date: 02/19/2022 ? ?Check In: ? Session Check In - 02/19/22 1611   ? ?  ? Check-In  ? Supervising physician immediately available to respond to emergencies See telemetry face sheet for immediately available ER MD   ? Location [facility]-Cardiac & Pulmonary Rehab   ? Staff Present Birdie Sons, MPA, RN;Joseph Idamay, RCP,RRT,BSRT;Kara Archie, MS, ASCM CEP, Exercise Physiologist   ? Virtual Visit No   ? Medication changes reported     No   ? Fall or balance concerns reported    No   ? Warm-up and Cool-down Performed on first and last piece of equipment   ? Resistance Training Performed Yes   ? VAD Patient? No   ? PAD/SET Patient? No   ?  ? Pain Assessment  ? Currently in Pain? No/denies   ? ?  ?  ? ?  ? ? ? ? ? ?Social History  ? ?Tobacco Use  ?Smoking Status Never  ?Smokeless Tobacco Never  ? ? ?Goals Met:  ?Independence with exercise equipment ?Exercise tolerated well ?No report of concerns or symptoms today ?Strength training completed today ? ?Goals Unmet:  ?Not Applicable ? ?Comments: Pt able to follow exercise prescription today without complaint.  Will continue to monitor for progression. ? ? ? ?Dr. Emily Filbert is Medical Director for [place].  ?Dr. Ottie Glazier is Medical Director for [facility] Pulmonary Rehabilitation. ?

## 2022-02-20 ENCOUNTER — Encounter: Payer: Medicare HMO | Admitting: *Deleted

## 2022-02-20 DIAGNOSIS — Z951 Presence of aortocoronary bypass graft: Secondary | ICD-10-CM | POA: Diagnosis not present

## 2022-02-20 DIAGNOSIS — Z48812 Encounter for surgical aftercare following surgery on the circulatory system: Secondary | ICD-10-CM | POA: Diagnosis not present

## 2022-02-20 NOTE — Progress Notes (Signed)
Daily Session Note ? ?Patient Details  ?Name: Jesse Curtis ?MRN: [id number] ?Date of Birth: October 10, 1946 ?Referring Provider:   ?Flowsheet Row Cardiac Rehab from 01/20/2022 in [facility] Cardiac and Pulmonary Rehab  ?Referring Provider Nehemiah Massed  ? ?  ? ? ?Encounter Date: 02/20/2022 ? ?Check In: ? Session Check In - 02/20/22 1607   ? ?  ? Check-In  ? Supervising physician immediately available to respond to emergencies See telemetry face sheet for immediately available ER MD   ? Location [facility]-Cardiac & Pulmonary Rehab   ? Staff Present Renita Papa, RN BSN;Joseph [place], RCP,RRT,BSRT;Jessica [place], [place], [place], [place], CCET   ? Virtual Visit No   ? Medication changes reported     No   ? Fall or balance concerns reported    No   ? Warm-up and Cool-down Performed on first and last piece of equipment   ? Resistance Training Performed Yes   ? VAD Patient? No   ? PAD/SET Patient? No   ?  ? Pain Assessment  ? Currently in Pain? No/denies   ? ?  ?  ? ?  ? ? ? ? ? ?Social History  ? ?Tobacco Use  ?Smoking Status Never  ?Smokeless Tobacco Never  ? ? ?Goals Met:  ?Independence with exercise equipment ?Exercise tolerated well ?No report of concerns or symptoms today ?Strength training completed today ? ?Goals Unmet:  ?Not Applicable ? ?Comments: Pt able to follow exercise prescription today without complaint.  Will continue to monitor for progression. ? ? ? ?Dr. Emily Filbert is Medical Director for [place].  ?Dr. Ottie Glazier is Medical Director for [facility] Pulmonary Rehabilitation. ?

## 2022-02-24 DIAGNOSIS — C61 Malignant neoplasm of prostate: Secondary | ICD-10-CM | POA: Diagnosis not present

## 2022-02-24 DIAGNOSIS — Z951 Presence of aortocoronary bypass graft: Secondary | ICD-10-CM

## 2022-02-24 DIAGNOSIS — N401 Enlarged prostate with lower urinary tract symptoms: Secondary | ICD-10-CM | POA: Diagnosis not present

## 2022-02-24 DIAGNOSIS — D400 Neoplasm of uncertain behavior of prostate: Secondary | ICD-10-CM | POA: Diagnosis not present

## 2022-02-24 DIAGNOSIS — Z48812 Encounter for surgical aftercare following surgery on the circulatory system: Secondary | ICD-10-CM | POA: Diagnosis not present

## 2022-02-24 NOTE — Progress Notes (Signed)
Daily Session Note ? ?Patient Details  ?Name: Jesse Curtis ?MRN: [id number] ?Date of Birth: [birth year] ?Referring Provider:   ?Flowsheet Row Cardiac Rehab from 01/20/2022 in [facility] Cardiac and Pulmonary Rehab  ?Referring Provider Nehemiah Massed  ? ?  ? ? ?Encounter Date: 02/24/2022 ? ?Check In: ? Session Check In - 02/24/22 1608   ? ?  ? Check-In  ? Supervising physician immediately available to respond to emergencies See telemetry face sheet for immediately available ER MD   ? Location [facility]-Cardiac & Pulmonary Rehab   ? Staff Present Birdie Sons, MPA, Nino Glow, MS, ASCM CEP, Exercise Physiologist;Telisa Ohlsen Amedeo Plenty, BS, ACSM CEP, Exercise Physiologist;Joseph [place], [place]   ? Virtual Visit No   ? Medication changes reported     No   ? Fall or balance concerns reported    No   ? Warm-up and Cool-down Performed on first and last piece of equipment   ? Resistance Training Performed Yes   ? VAD Patient? No   ? PAD/SET Patient? No   ?  ? Pain Assessment  ? Currently in Pain? No/denies   ? ?  ?  ? ?  ? ? ? ? ? ?Social History  ? ?Tobacco Use  ?Smoking Status Never  ?Smokeless Tobacco Never  ? ? ?Goals Met:  ?Independence with exercise equipment ?Exercise tolerated well ?No report of concerns or symptoms today ?Strength training completed today ? ?Goals Unmet:  ?Not Applicable ? ?Comments: Pt able to follow exercise prescription today without complaint.  Will continue to monitor for progression. ? ? ? ?Dr. Emily Filbert is Medical Director for [place].  ?Dr. Ottie Glazier is Medical Director for [facility] Pulmonary Rehabilitation. ?

## 2022-02-26 DIAGNOSIS — Z951 Presence of aortocoronary bypass graft: Secondary | ICD-10-CM

## 2022-02-26 DIAGNOSIS — Z48812 Encounter for surgical aftercare following surgery on the circulatory system: Secondary | ICD-10-CM | POA: Diagnosis not present

## 2022-02-26 NOTE — Progress Notes (Signed)
Daily Session Note ? ?Patient Details  ?Name: Jesse Curtis ?MRN: [id number] ?Date of Birth: 06-12-1946 ?Referring Provider:   ?Flowsheet Row Cardiac Rehab from 01/20/2022 in [facility] Cardiac and Pulmonary Rehab  ?Referring Provider Nehemiah Massed  ? ?  ? ? ?Encounter Date: 02/26/2022 ? ?Check In: ? Session Check In - 02/26/22 1555   ? ?  ? Check-In  ? Supervising physician immediately available to respond to emergencies See telemetry face sheet for immediately available ER MD   ? Location [facility]-Cardiac & Pulmonary Rehab   ? Staff Present Birdie Sons, MPA, RN;Joseph [place], RCP,RRT,BSRT;Melissa Wever, RDN, LDN   ? Virtual Visit No   ? Medication changes reported     No   ? Fall or balance concerns reported    No   ? Warm-up and Cool-down Performed on first and last piece of equipment   ? Resistance Training Performed Yes   ? VAD Patient? No   ? PAD/SET Patient? No   ?  ? Pain Assessment  ? Currently in Pain? No/denies   ? ?  ?  ? ?  ? ? ? ? ? ?Social History  ? ?Tobacco Use  ?Smoking Status Never  ?Smokeless Tobacco Never  ? ? ?Goals Met:  ?Independence with exercise equipment ?Exercise tolerated well ?No report of concerns or symptoms today ?Strength training completed today ? ?Goals Unmet:  ?Not Applicable ? ?Comments: Pt able to follow exercise prescription today without complaint.  Will continue to monitor for progression. ? ? ? ?Dr. Emily Filbert is Medical Director for [place].  ?Dr. Ottie Glazier is Medical Director for [facility] Pulmonary Rehabilitation. ?

## 2022-02-27 DIAGNOSIS — Z951 Presence of aortocoronary bypass graft: Secondary | ICD-10-CM | POA: Diagnosis not present

## 2022-02-27 DIAGNOSIS — Z48812 Encounter for surgical aftercare following surgery on the circulatory system: Secondary | ICD-10-CM | POA: Diagnosis not present

## 2022-02-27 NOTE — Progress Notes (Signed)
Daily Session Note ? ?Patient Details  ?Name: Ziair Penson Bushey ?MRN: [id number] ?Date of Birth: February 21, 1946 ?Referring Provider:   ?Flowsheet Row Cardiac Rehab from 01/20/2022 in [facility] Cardiac and Pulmonary Rehab  ?Referring Provider Nehemiah Massed  ? ?  ? ? ?Encounter Date: 02/27/2022 ? ?Check In: ? Session Check In - 02/27/22 1615   ? ?  ? Check-In  ? Supervising physician immediately available to respond to emergencies See telemetry face sheet for immediately available ER MD   ? Location [facility]-Cardiac & Pulmonary Rehab   ? Staff Present Coralie Keens, MS, ASCM CEP, Exercise Physiologist;Yuvaan Olander, RN, BSN;Joseph [place], RCP,RRT,BSRT   ? Virtual Visit No   ? Medication changes reported     No   ? Fall or balance concerns reported    No   ? Warm-up and Cool-down Performed on first and last piece of equipment   ? Resistance Training Performed Yes   ? VAD Patient? No   ? PAD/SET Patient? No   ?  ? Pain Assessment  ? Currently in Pain? No/denies   ? ?  ?  ? ?  ? ? ? ? ? ?Social History  ? ?Tobacco Use  ?Smoking Status Never  ?Smokeless Tobacco Never  ? ? ?Goals Met:  ?Independence with exercise equipment ?Exercise tolerated well ?No report of concerns or symptoms today ?Strength training completed today ? ?Goals Unmet:  ?Not Applicable ? ?Comments: Pt able to follow exercise prescription today without complaint.  Will continue to monitor for progression. ? ? ?Dr. Emily Filbert is Medical Director for [place].  ?Dr. Ottie Glazier is Medical Director for [facility] Pulmonary Rehabilitation. ?

## 2022-03-03 ENCOUNTER — Encounter: Payer: Medicare HMO | Attending: Internal Medicine

## 2022-03-03 DIAGNOSIS — Z951 Presence of aortocoronary bypass graft: Secondary | ICD-10-CM | POA: Insufficient documentation

## 2022-03-03 DIAGNOSIS — Z48812 Encounter for surgical aftercare following surgery on the circulatory system: Secondary | ICD-10-CM | POA: Insufficient documentation

## 2022-03-03 NOTE — Progress Notes (Signed)
Daily Session Note ? ?Patient Details  ?Name: Jesse Curtis ?MRN: [id number] ?Date of Birth: 1946/07/10 ?Referring Provider:   ?Flowsheet Row Cardiac Rehab from 01/20/2022 in [facility] Cardiac and Pulmonary Rehab  ?Referring Provider Nehemiah Massed  ? ?  ? ? ?Encounter Date: 03/03/2022 ? ?Check In: ? Session Check In - 03/03/22 1639   ? ?  ? Check-In  ? Supervising physician immediately available to respond to emergencies See telemetry face sheet for immediately available ER MD   ? Location [facility]-Cardiac & Pulmonary Rehab   ? Staff Present Birdie Sons, MPA, RN;Melissa [place], RDN, LDN;Joseph [place], RCP,RRT,BSRT   ? Virtual Visit No   ? Medication changes reported     No   ? Fall or balance concerns reported    No   ? Warm-up and Cool-down Performed on first and last piece of equipment   ? Resistance Training Performed Yes   ? VAD Patient? No   ? PAD/SET Patient? No   ?  ? Pain Assessment  ? Currently in Pain? No/denies   ? ?  ?  ? ?  ? ? ? ? ? ?Social History  ? ?Tobacco Use  ?Smoking Status Never  ?Smokeless Tobacco Never  ? ? ?Goals Met:  ?Independence with exercise equipment ?Exercise tolerated well ?No report of concerns or symptoms today ?Strength training completed today ? ?Goals Unmet:  ?Not Applicable ? ?Comments: Pt able to follow exercise prescription today without complaint.  Will continue to monitor for progression. ? ? ? ?Dr. Emily Filbert is Medical Director for [place].  ?Dr. Ottie Glazier is Medical Director for [facility] Pulmonary Rehabilitation. ?

## 2022-03-04 DIAGNOSIS — I25810 Atherosclerosis of coronary artery bypass graft(s) without angina pectoris: Secondary | ICD-10-CM | POA: Diagnosis not present

## 2022-03-05 DIAGNOSIS — Z951 Presence of aortocoronary bypass graft: Secondary | ICD-10-CM

## 2022-03-05 DIAGNOSIS — Z48812 Encounter for surgical aftercare following surgery on the circulatory system: Secondary | ICD-10-CM | POA: Diagnosis not present

## 2022-03-05 NOTE — Progress Notes (Signed)
Daily Session Note ? ?Patient Details  ?Name: Jesse Curtis ?MRN: [id number] ?Date of Birth: [birth year] ?Referring Provider:   ?Flowsheet Row Cardiac Rehab from 01/20/2022 in [facility] Cardiac and Pulmonary Rehab  ?Referring Provider Nehemiah Massed  ? ?  ? ? ?Encounter Date: 03/05/2022 ? ?Check In: ? Session Check In - 03/05/22 1559   ? ?  ? Check-In  ? Supervising physician immediately available to respond to emergencies See telemetry face sheet for immediately available ER MD   ? Location [facility]-Cardiac & Pulmonary Rehab   ? Staff Present Birdie Sons, MPA, RN;Joseph [place], RCP,RRT,BSRT;Melissa [place], RDN, LDN   ? Virtual Visit No   ? Medication changes reported     No   ? Fall or balance concerns reported    No   ? Warm-up and Cool-down Performed on first and last piece of equipment   ? Resistance Training Performed Yes   ? VAD Patient? No   ? PAD/SET Patient? No   ?  ? Pain Assessment  ? Currently in Pain? No/denies   ? ?  ?  ? ?  ? ? ? ? ? ?Social History  ? ?Tobacco Use  ?Smoking Status Never  ?Smokeless Tobacco Never  ? ? ?Goals Met:  ?Independence with exercise equipment ?Exercise tolerated well ?No report of concerns or symptoms today ?Strength training completed today ? ?Goals Unmet:  ?Not Applicable ? ?Comments: Pt able to follow exercise prescription today without complaint.  Will continue to monitor for progression. ? ? ? ?Dr. Emily Filbert is Medical Director for [place].  ?Dr. Ottie Glazier is Medical Director for [facility] Pulmonary Rehabilitation. ?

## 2022-03-10 DIAGNOSIS — E7800 Pure hypercholesterolemia, unspecified: Secondary | ICD-10-CM | POA: Diagnosis not present

## 2022-03-10 DIAGNOSIS — Z9989 Dependence on other enabling machines and devices: Secondary | ICD-10-CM | POA: Diagnosis not present

## 2022-03-10 DIAGNOSIS — I493 Ventricular premature depolarization: Secondary | ICD-10-CM | POA: Diagnosis not present

## 2022-03-10 DIAGNOSIS — G4733 Obstructive sleep apnea (adult) (pediatric): Secondary | ICD-10-CM | POA: Diagnosis not present

## 2022-03-10 DIAGNOSIS — I25810 Atherosclerosis of coronary artery bypass graft(s) without angina pectoris: Secondary | ICD-10-CM | POA: Diagnosis not present

## 2022-03-10 DIAGNOSIS — I10 Essential (primary) hypertension: Secondary | ICD-10-CM | POA: Diagnosis not present

## 2022-03-12 DIAGNOSIS — Z951 Presence of aortocoronary bypass graft: Secondary | ICD-10-CM

## 2022-03-12 DIAGNOSIS — Z48812 Encounter for surgical aftercare following surgery on the circulatory system: Secondary | ICD-10-CM | POA: Diagnosis not present

## 2022-03-12 NOTE — Progress Notes (Signed)
Daily Session Note ? ?Patient Details  ?Name: Osman Calzadilla Bodie ?MRN: [id number] ?Date of Birth: 09-02-1946 ?Referring Provider:   ?Flowsheet Row Cardiac Rehab from 01/20/2022 in [facility] Cardiac and Pulmonary Rehab  ?Referring Provider Nehemiah Massed  ? ?  ? ? ?Encounter Date: 03/12/2022 ? ?Check In: ? Session Check In - 03/12/22 1611   ? ?  ? Check-In  ? Supervising physician immediately available to respond to emergencies See telemetry face sheet for immediately available ER MD   ? Location [facility]-Cardiac & Pulmonary Rehab   ? Staff Present Birdie Sons, MPA, RN;Melissa [place], RDN, LDN;Joseph [place], RCP,RRT,BSRT   ? Virtual Visit No   ? Medication changes reported     No   ? Fall or balance concerns reported    No   ? Warm-up and Cool-down Performed on first and last piece of equipment   ? Resistance Training Performed Yes   ? VAD Patient? No   ? PAD/SET Patient? No   ?  ? Pain Assessment  ? Currently in Pain? No/denies   ? ?  ?  ? ?  ? ? ? ? ? ?Social History  ? ?Tobacco Use  ?Smoking Status Never  ?Smokeless Tobacco Never  ? ? ?Goals Met:  ?Independence with exercise equipment ?Exercise tolerated well ?No report of concerns or symptoms today ?Strength training completed today ? ?Goals Unmet:  ?Not Applicable ? ?Comments: Pt able to follow exercise prescription today without complaint.  Will continue to monitor for progression. ? ? ? ?Dr. Emily Filbert is Medical Director for [place].  ?Dr. Ottie Glazier is Medical Director for [facility] Pulmonary Rehabilitation. ?

## 2022-03-13 DIAGNOSIS — Z951 Presence of aortocoronary bypass graft: Secondary | ICD-10-CM | POA: Diagnosis not present

## 2022-03-13 DIAGNOSIS — Z48812 Encounter for surgical aftercare following surgery on the circulatory system: Secondary | ICD-10-CM | POA: Diagnosis not present

## 2022-03-13 NOTE — Progress Notes (Signed)
Daily Session Note ? ?Patient Details  ?Name: Jesse Curtis ?MRN: [id number] ?Date of Birth: 18-Oct-1946 ?Referring Provider:   ?Flowsheet Row Cardiac Rehab from 01/20/2022 in [facility] Cardiac and Pulmonary Rehab  ?Referring Provider Nehemiah Massed  ? ?  ? ? ?Encounter Date: 03/13/2022 ? ?Check In: ? Session Check In - 03/13/22 1611   ? ?  ? Check-In  ? Supervising physician immediately available to respond to emergencies See telemetry face sheet for immediately available ER MD   ? Location [facility]-Cardiac & Pulmonary Rehab   ? Staff Present Coralie Keens, MS, ASCM CEP, Exercise Physiologist;Alycea Segoviano, RN, BSN;Joseph [place], RCP,RRT,BSRT   ? Virtual Visit No   ? Medication changes reported     No   ? Fall or balance concerns reported    No   ? Warm-up and Cool-down Performed on first and last piece of equipment   ? Resistance Training Performed Yes   ? VAD Patient? No   ? PAD/SET Patient? No   ?  ? Pain Assessment  ? Currently in Pain? No/denies   ? ?  ?  ? ?  ? ? ? ? ? ?Social History  ? ?Tobacco Use  ?Smoking Status Never  ?Smokeless Tobacco Never  ? ? ?Goals Met:  ?Independence with exercise equipment ?Exercise tolerated well ?No report of concerns or symptoms today ?Strength training completed today ? ?Goals Unmet:  ?Not Applicable ? ?Comments: Pt able to follow exercise prescription today without complaint.  Will continue to monitor for progression. ? ? ?Dr. Emily Filbert is Medical Director for [place].  ?Dr. Ottie Glazier is Medical Director for [facility] Pulmonary Rehabilitation. ?

## 2022-03-17 DIAGNOSIS — Z951 Presence of aortocoronary bypass graft: Secondary | ICD-10-CM | POA: Diagnosis not present

## 2022-03-17 DIAGNOSIS — Z48812 Encounter for surgical aftercare following surgery on the circulatory system: Secondary | ICD-10-CM | POA: Diagnosis not present

## 2022-03-17 NOTE — Progress Notes (Signed)
Daily Session Note ? ?Patient Details  ?Name: Jesse Curtis ?MRN: [id number] ?Date of Birth: 1946-07-13 ?Referring Provider:   ?Flowsheet Row Cardiac Rehab from 01/20/2022 in [facility] Cardiac and Pulmonary Rehab  ?Referring Provider Nehemiah Massed  ? ?  ? ? ?Encounter Date: 03/17/2022 ? ?Check In: ? Session Check In - 03/17/22 1551   ? ?  ? Check-In  ? Supervising physician immediately available to respond to emergencies See telemetry face sheet for immediately available ER MD   ? Location [facility]-Cardiac & Pulmonary Rehab   ? Staff Present Birdie Sons, MPA, Nino Glow, MS, ASCM CEP, Exercise Physiologist;Joseph [place], [place]   ? Virtual Visit No   ? Medication changes reported     No   ? Fall or balance concerns reported    No   ? Warm-up and Cool-down Performed on first and last piece of equipment   ? Resistance Training Performed Yes   ? VAD Patient? No   ? PAD/SET Patient? No   ?  ? Pain Assessment  ? Currently in Pain? No/denies   ? ?  ?  ? ?  ? ? ? ? ? ?Social History  ? ?Tobacco Use  ?Smoking Status Never  ?Smokeless Tobacco Never  ? ? ?Goals Met:  ?Independence with exercise equipment ?Exercise tolerated well ?No report of concerns or symptoms today ?Strength training completed today ? ?Goals Unmet:  ?Not Applicable ? ?Comments: Pt able to follow exercise prescription today without complaint.  Will continue to monitor for progression. ? ? ? ?Dr. Emily Filbert is Medical Director for [place].  ?Dr. Ottie Glazier is Medical Director for [facility] Pulmonary Rehabilitation. ?

## 2022-03-19 ENCOUNTER — Encounter: Payer: Self-pay | Admitting: *Deleted

## 2022-03-19 DIAGNOSIS — Z48812 Encounter for surgical aftercare following surgery on the circulatory system: Secondary | ICD-10-CM | POA: Diagnosis not present

## 2022-03-19 DIAGNOSIS — Z951 Presence of aortocoronary bypass graft: Secondary | ICD-10-CM

## 2022-03-19 NOTE — Progress Notes (Signed)
Daily Session Note ? ?Patient Details  ?Name: Jesse Curtis ?MRN: [id number] ?Date of Birth: 1946/10/06 ?Referring Provider:   ?Flowsheet Row Cardiac Rehab from 01/20/2022 in [facility] Cardiac and Pulmonary Rehab  ?Referring Provider Nehemiah Massed  ? ?  ? ? ?Encounter Date: 03/19/2022 ? ?Check In: ? Session Check In - 03/19/22 1617   ? ?  ? Check-In  ? Supervising physician immediately available to respond to emergencies See telemetry face sheet for immediately available ER MD   ? Location [facility]-Cardiac & Pulmonary Rehab   ? Staff Present Birdie Sons, MPA, RN;Melissa Pronghorn, RDN, LDN;Joseph [place], RCP,RRT,BSRT   ? Virtual Visit No   ? Medication changes reported     No   ? Fall or balance concerns reported    No   ? Warm-up and Cool-down Performed on first and last piece of equipment   ? Resistance Training Performed Yes   ? VAD Patient? No   ? PAD/SET Patient? No   ?  ? Pain Assessment  ? Currently in Pain? No/denies   ? ?  ?  ? ?  ? ? ? ? ? ?Social History  ? ?Tobacco Use  ?Smoking Status Never  ?Smokeless Tobacco Never  ? ? ?Goals Met:  ?Independence with exercise equipment ?Exercise tolerated well ?No report of concerns or symptoms today ?Strength training completed today ? ?Goals Unmet:  ?Not Applicable ? ?Comments: Pt able to follow exercise prescription today without complaint.  Will continue to monitor for progression. ? ? ? ?Dr. Emily Filbert is Medical Director for [place].  ?Dr. Ottie Glazier is Medical Director for [facility] Pulmonary Rehabilitation. ?

## 2022-03-19 NOTE — Progress Notes (Signed)
Cardiac Individual Treatment Plan ? ?Patient Details  ?Name: Jesse Curtis ?MRN: [id number] ?Date of Birth: [birth year] ?Referring Provider:   ?Flowsheet Row Cardiac Rehab from 01/20/2022 in [facility] Cardiac and Pulmonary Rehab  ?Referring Provider Nehemiah Massed  ? ?  ? ? ?Initial Encounter Date:  ?Flowsheet Row Cardiac Rehab from 01/20/2022 in [facility] Cardiac and Pulmonary Rehab  ?Date 01/20/22  ? ?  ? ? ?Visit Diagnosis: S/P CABG x 4 ? ?Patient's Home Medications on Admission: ? ?Current Outpatient Medications:  ?  acetaminophen (TYLENOL) 650 MG CR tablet, Take 1,300 mg by mouth every 8 (eight) hours as needed for pain., Disp: , Rfl:  ?  Ascorbic Acid (VITAMIN C PO), Take 1,500 mg by mouth daily., Disp: , Rfl:  ?  aspirin 81 MG EC tablet, Take by mouth., Disp: , Rfl:  ?  atorvastatin (LIPITOR) 40 MG tablet, Take 40 mg by mouth daily., Disp: , Rfl:  ?  Cholecalciferol (VITAMIN D3) 125 MCG (5000 UT) CAPS, Take 5,000 Units by mouth daily., Disp: , Rfl:  ?  cyanocobalamin 1000 MCG tablet, Take 1 tablet by mouth daily., Disp: , Rfl:  ?  ferrous sulfate 325 (65 FE) MG tablet, Take 325 mg by mouth daily with breakfast., Disp: , Rfl:  ?  finasteride (PROSCAR) 5 MG tablet, Take 5 mg by mouth daily., Disp: , Rfl:  ?  glimepiride (AMARYL) 4 MG tablet, Take 4 mg by mouth daily with breakfast. (Patient not taking: Reported on 01/10/2022), Disp: , Rfl:  ?  insulin isophane & regular human KwikPen (NOVOLIN 70/30 KWIKPEN) (70-30) 100 UNIT/ML KwikPen, INJECT 28 UNITS SUBCUTANEOUSLY IN THE MORNING AND 20 UNITS AT NIGHT, Disp: , Rfl:  ?  lisinopril (ZESTRIL) 5 MG tablet, Take 5 mg by mouth daily. (Patient not taking: Reported on 01/10/2022), Disp: , Rfl:  ?  metFORMIN (GLUCOPHAGE) 500 MG tablet, Take 1,000 mg by mouth 2 (two) times daily., Disp: , Rfl:  ?  metoprolol tartrate (LOPRESSOR) 25 MG tablet, Take by mouth., Disp: , Rfl:  ?  Multiple Vitamin (MULTIVITAMIN WITH MINERALS) TABS tablet, Take 1 tablet by mouth daily., Disp: , Rfl:  ?  Multiple  Vitamins-Minerals (EYE VITAMINS & MINERALS PO), Take 1 tablet by mouth daily., Disp: , Rfl:  ?  Omega-3 Fatty Acids (FISH OIL) 1000 MG CAPS, Take 1,000 mg by mouth daily., Disp: , Rfl:  ?  omeprazole (PRILOSEC OTC) 20 MG tablet, Take 20 mg by mouth every other day. In the morning., Disp: , Rfl:  ?  pioglitazone (ACTOS) 30 MG tablet, Take 30 mg by mouth daily. (Patient not taking: Reported on 01/10/2022), Disp: , Rfl:  ?  vitamin B-12 (CYANOCOBALAMIN) 1000 MCG tablet, Take 1,000 mcg by mouth daily. (Patient not taking: Reported on 01/10/2022), Disp: , Rfl:  ?  zinc gluconate 50 MG tablet, Take 50 mg by mouth daily. (Patient not taking: Reported on 01/10/2022), Disp: , Rfl:  ? ?Past Medical History: ?Past Medical History:  ?Diagnosis Date  ? 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 Diabetes mellitus without complication ([place])   ? Fatigue   ? POSSIBLE SEIZURE  NO EVAL  NO FUTHER PROBLEM  ? GERD (gastroesophageal reflux disease)   ? ULCERS  ? Hypertension   ? ? ?Tobacco Use: ?Social History  ? ?Tobacco Use  ?Smoking Status Never  ?Smokeless Tobacco Never  ? ? ?Labs: ?Review Flowsheet   ? ?    ? View : No data to display.  ?  ?  ?  ?  ?  ? ? ? ?  Exercise Target Goals: ?Exercise Program Goal: ?Individual exercise prescription set using results from initial 6 min walk test and THRR while considering  patient?s activity barriers and safety.  ? ?Exercise Prescription Goal: ?Initial exercise prescription builds to 30-45 minutes a day of aerobic activity, 2-3 days per week.  Home exercise guidelines will be given to patient during program as part of exercise prescription that the participant will acknowledge. ? ? ?Education: Aerobic Exercise: ?- Group verbal and visual presentation on the components of exercise prescription. Introduces F.I.T.T principle from ACSM for exercise prescriptions.  Reviews F.I.T.T. principles of aerobic exercise including progression. Written material given at graduation. ?Flowsheet Row Cardiac Rehab from 03/12/2022  in [facility] Cardiac and Pulmonary Rehab  ?Date 03/05/22  ?Educator KL  ?Instruction Review Code 1- Verbalizes Understanding  ? ?  ? ? ?Education: Resistance Exercise: ?- Group verbal and visual presentation on the components of exercise prescription. Introduces F.I.T.T principle from ACSM for exercise prescriptions  Reviews F.I.T.T. principles of resistance exercise including progression. Written material given at graduation. ?Flowsheet Row Cardiac Rehab from 03/12/2022 in [facility] Cardiac and Pulmonary Rehab  ?Date 03/12/22  ?Educator [facility]  ?Instruction Review Code 1- Verbalizes Understanding  ? ?  ? ?  ?Education: Exercise & Equipment Safety: ?- Individual verbal instruction and demonstration of equipment use and safety with use of the equipment. ?Flowsheet Row Cardiac Rehab from 03/12/2022 in [facility] Cardiac and Pulmonary Rehab  ?Date 01/20/22  ?Educator AS  ?Instruction Review Code 1- Verbalizes Understanding  ? ?  ? ? ?Education: Exercise Physiology & General Exercise Guidelines: ?- Group verbal and written instruction with models to review the exercise physiology of the cardiovascular system and associated critical values. Provides general exercise guidelines with specific guidelines to those with heart or lung disease.  ?Flowsheet Row Cardiac Rehab from 03/12/2022 in [facility] Cardiac and Pulmonary Rehab  ?Education need identified 01/20/22  ?Date 02/26/22  ?Educator KL  ?Instruction Review Code 1- Verbalizes Understanding  ? ?  ? ? ?Education: Flexibility, Balance, Mind/Body Relaxation: ?- Group verbal and visual presentation with interactive activity on the components of exercise prescription. Introduces F.I.T.T principle from ACSM for exercise prescriptions. Reviews F.I.T.T. principles of flexibility and balance exercise training including progression. Also discusses the mind body connection.  Reviews various relaxation techniques to help reduce and manage stress (i.e. Deep breathing, progressive muscle relaxation, and  visualization). Balance handout provided to take home. Written material given at graduation. ? ? ?Activity Barriers & Risk Stratification: ? Activity Barriers & Cardiac Risk Stratification - 01/10/22 1304   ? ?  ? Activity Barriers & Cardiac Risk Stratification  ? Activity Barriers None   ? Cardiac Risk Stratification High   ? ?  ?  ? ?  ? ? ?6 Minute Walk: ? 6 Minute Walk   ? ? [place] Name 01/20/22 1604  ?  ?  ?  ? 6 Minute Walk  ? Distance 888 feet    ? Walk Time 6 minutes    ? # of Rest Breaks 0    ? MPH 1.7    ? METS 1.9    ? RPE 11    ? Perceived Dyspnea  0    ? VO2 Peak 6.63    ? Symptoms Yes (comment)    ? Comments leg fatigue (general)    ? Resting HR 82 bpm    ? Resting BP 104/54    ? Resting Oxygen Saturation  98 %    ? Exercise Oxygen Saturation  during  6 min walk 97 %    ? Max Ex. HR 103 bpm    ? Max Ex. BP 138/64    ? 2 Minute Post BP 124/60    ? ?  ?  ? ?  ? ? ?Oxygen Initial Assessment: ? ? ?Oxygen Re-Evaluation: ? ? ?Oxygen Discharge (Final Oxygen Re-Evaluation): ? ? ?Initial Exercise Prescription: ? Initial Exercise Prescription - 01/20/22 1600   ? ?  ? Date of Initial Exercise RX and Referring Provider  ? Date 01/20/22   ? Referring Provider Nehemiah Massed   ?  ? Oxygen  ? Maintain Oxygen Saturation 88% or higher   ?  ? Treadmill  ? MPH 1.5   ? Grade 0   ? Minutes 15   ? METs 2   ?  ? NuStep  ? Level 2   ? SPM 80   ? Minutes 15   ? METs 1.9   ?  ? Recumbant Elliptical  ? Level 1   ? RPM 50   ? Minutes 15   ? METs 1.9   ?  ? REL-XR  ? Level 2   ? Speed 50   ? Minutes 15   ? METs 1.9   ?  ? Track  ? Laps 15   ? Minutes 15   ? METs 1.9   ?  ? Prescription Details  ? Frequency (times per week) 3   ? Duration Progress to 30 minutes of continuous aerobic without signs/symptoms of physical distress   ?  ? Intensity  ? THRR 40-80% of Max Heartrate 107-132   ? Ratings of Perceived Exertion 11-13   ? Perceived Dyspnea 0-4   ?  ? Progression  ? Progression Continue to progress workloads to maintain intensity without  signs/symptoms of physical distress.   ?  ? Resistance Training  ? Training Prescription Yes   ? Weight 3 lb   ? Reps 10-15   ? ?  ?  ? ?  ? ? ?Perform Capillary Blood Glucose checks as needed. ? ?Exercise Pr

## 2022-03-20 DIAGNOSIS — Z951 Presence of aortocoronary bypass graft: Secondary | ICD-10-CM | POA: Diagnosis not present

## 2022-03-20 DIAGNOSIS — Z48812 Encounter for surgical aftercare following surgery on the circulatory system: Secondary | ICD-10-CM | POA: Diagnosis not present

## 2022-03-20 NOTE — Progress Notes (Signed)
Daily Session Note  Patient Details  Name: Jesse Curtis MRN: [id number] Date of Birth: 03-21-1946 Referring Provider:   Flowsheet Row Cardiac Rehab from 01/20/2022 in [facility] Cardiac and Pulmonary Rehab  Referring Provider Nehemiah Massed       Encounter Date: 03/20/2022  Check In:  Session Check In - 03/20/22 1600       Check-In   Supervising physician immediately available to respond to emergencies See telemetry face sheet for immediately available ER MD    Location [facility]-Cardiac & Pulmonary Rehab    Staff Present Coralie Keens, MS, ASCM CEP, Exercise Physiologist;Paiden Caraveo Desma Maxim, RN, BSN;Joseph [place], RCP,RRT,BSRT    Virtual Visit No    Medication changes reported     No    Fall or balance concerns reported    No    Warm-up and Cool-down Performed on first and last piece of equipment    Resistance Training Performed Yes    VAD Patient? No    PAD/SET Patient? No      Pain Assessment   Currently in Pain? No/denies                Social History   Tobacco Use  Smoking Status Never  Smokeless Tobacco Never    Goals Met:  Proper associated with RPD/PD & O2 Sat Independence with exercise equipment Using PLB without cueing & demonstrates good technique Exercise tolerated well No report of concerns or symptoms today Strength training completed today  Goals Unmet:  Not Applicable  Comments: Pt able to follow exercise prescription today without complaint.  Will continue to monitor for progression.   Dr. Emily Filbert is Medical Director for [place].  Dr. Ottie Glazier is Medical Director for [facility] Pulmonary Rehabilitation.

## 2022-03-26 DIAGNOSIS — Z951 Presence of aortocoronary bypass graft: Secondary | ICD-10-CM | POA: Diagnosis not present

## 2022-03-26 DIAGNOSIS — Z48812 Encounter for surgical aftercare following surgery on the circulatory system: Secondary | ICD-10-CM | POA: Diagnosis not present

## 2022-03-26 NOTE — Progress Notes (Signed)
Daily Session Note  Patient Details  Name: Jesse Curtis MRN: [id number] Date of Birth: 06/10/1946 Referring Provider:   Flowsheet Row Cardiac Rehab from 01/20/2022 in [facility] Cardiac and Pulmonary Rehab  Referring Provider [place]       Encounter Date: 03/26/2022  Check In:  Session Check In - 03/26/22 1600       Check-In   Supervising physician immediately available to respond to emergencies See telemetry face sheet for immediately available ER MD    Location [facility]-Cardiac & Pulmonary Rehab    Staff Present Kelly Bollinger, MPA, RN;Kara Langdon, MS, ASCM CEP, Exercise Physiologist    Virtual Visit No    Medication changes reported     No    Fall or balance concerns reported    No    Warm-up and Cool-down Performed on first and last piece of equipment    Resistance Training Performed Yes    VAD Patient? No    PAD/SET Patient? No      Pain Assessment   Currently in Pain? No/denies                Social History   Tobacco Use  Smoking Status Never  Smokeless Tobacco Never    Goals Met:  Independence with exercise equipment Exercise tolerated well No report of concerns or symptoms today Strength training completed today  Goals Unmet:  Not Applicable  Comments: Pt able to follow exercise prescription today without complaint.  Will continue to monitor for progression.    Dr. Mark Miller is Medical Director for [facility] Cardiac Rehabilitation.  Dr. Fuad Aleskerov is Medical Director for [facility] Pulmonary Rehabilitation. 

## 2022-03-27 ENCOUNTER — Encounter: Payer: Medicare HMO | Admitting: *Deleted

## 2022-03-27 DIAGNOSIS — Z951 Presence of aortocoronary bypass graft: Secondary | ICD-10-CM

## 2022-03-27 DIAGNOSIS — Z48812 Encounter for surgical aftercare following surgery on the circulatory system: Secondary | ICD-10-CM | POA: Diagnosis not present

## 2022-03-27 NOTE — Progress Notes (Signed)
Daily Session Note  Patient Details  Name: Jesse Curtis MRN: [id number] Date of Birth: [birth year] Referring Provider:   Flowsheet Row Cardiac Rehab from 01/20/2022 in [facility] Cardiac and Pulmonary Rehab  Referring Provider Nehemiah Massed       Encounter Date: 03/27/2022  Check In:  Session Check In - 03/27/22 1604       Check-In   Supervising physician immediately available to respond to emergencies See telemetry face sheet for immediately available ER MD    Location [facility]-Cardiac & Pulmonary Rehab    Staff Present Nyoka Cowden, RN, BSN, Lauretta Grill, [place], [place], RCEP, CCRP, CCET    Virtual Visit No    Medication changes reported     No    Fall or balance concerns reported    No    Tobacco Cessation No Change    Warm-up and Cool-down Performed on first and last piece of equipment    Resistance Training Performed Yes    VAD Patient? No    PAD/SET Patient? No      Pain Assessment   Currently in Pain? No/denies                Social History   Tobacco Use  Smoking Status Never  Smokeless Tobacco Never    Goals Met:  Independence with exercise equipment Exercise tolerated well No report of concerns or symptoms today  Goals Unmet:  Not Applicable  Comments: Pt able to follow exercise prescription today without complaint.  Will continue to monitor for progression.    Dr. Emily Filbert is Medical Director for [place].  Dr. Ottie Glazier is Medical Director for [facility] Pulmonary Rehabilitation.

## 2022-04-02 ENCOUNTER — Encounter: Payer: Medicare HMO | Admitting: *Deleted

## 2022-04-02 DIAGNOSIS — Z48812 Encounter for surgical aftercare following surgery on the circulatory system: Secondary | ICD-10-CM | POA: Diagnosis not present

## 2022-04-02 DIAGNOSIS — Z951 Presence of aortocoronary bypass graft: Secondary | ICD-10-CM

## 2022-04-02 NOTE — Progress Notes (Signed)
Daily Session Note  Patient Details  Name: Jesse Curtis MRN: [id number] Date of Birth: 1946-07-27 Referring Provider:   Flowsheet Row Cardiac Rehab from 01/20/2022 in [facility] Cardiac and Pulmonary Rehab  Referring Provider Nehemiah Massed       Encounter Date: 04/02/2022  Check In:  Session Check In - 04/02/22 1615       Check-In   Supervising physician immediately available to respond to emergencies See telemetry face sheet for immediately available ER MD    Location [facility]-Cardiac & Pulmonary Rehab    Staff Present Nyoka Cowden, RN, BSN, Tyna Jaksch, MS, ASCM CEP, Exercise Physiologist;Joseph Tessie [place], [place]    Virtual Visit No    Medication changes reported     No    Fall or balance concerns reported    No    Tobacco Cessation No Change    Warm-up and Cool-down Performed on first and last piece of equipment    Resistance Training Performed Yes    VAD Patient? No    PAD/SET Patient? No      Pain Assessment   Currently in Pain? No/denies                Social History   Tobacco Use  Smoking Status Never  Smokeless Tobacco Never    Goals Met:  Independence with exercise equipment Exercise tolerated well No report of concerns or symptoms today  Goals Unmet:  Not Applicable  Comments: Pt able to follow exercise prescription today without complaint.  Will continue to monitor for progression.    Dr. Emily Filbert is Medical Director for [place].  Dr. Ottie Glazier is Medical Director for [facility] Pulmonary Rehabilitation.

## 2022-04-03 ENCOUNTER — Encounter: Payer: Medicare HMO | Attending: Internal Medicine

## 2022-04-03 VITALS — Ht 65.75 in | Wt 172.2 lb

## 2022-04-03 DIAGNOSIS — Z951 Presence of aortocoronary bypass graft: Secondary | ICD-10-CM | POA: Insufficient documentation

## 2022-04-03 NOTE — Progress Notes (Signed)
Daily Session Note  Patient Details  Name: Jesse Curtis MRN: [id number] Date of Birth: 1946-10-30 Referring Provider:   Flowsheet Row Cardiac Rehab from 01/20/2022 in [facility] Cardiac and Pulmonary Rehab  Referring Provider Nehemiah Massed       Encounter Date: 04/03/2022  Check In:  Session Check In - 04/03/22 1600       Check-In   Supervising physician immediately available to respond to emergencies See telemetry face sheet for immediately available ER MD    Location [facility]-Cardiac & Pulmonary Rehab    Staff Present Vida Rigger, RN, BSN;Joseph [place], RCP,RRT,BSRT;Jessica [place], [place], RCEP, CCRP, CCET    Virtual Visit No    Medication changes reported     No    Fall or balance concerns reported    No    Tobacco Cessation No Change    Warm-up and Cool-down Performed on first and last piece of equipment    Resistance Training Performed Yes    VAD Patient? No    PAD/SET Patient? No      Pain Assessment   Currently in Pain? No/denies                Social History   Tobacco Use  Smoking Status Never  Smokeless Tobacco Never    Goals Met:  Proper associated with RPD/PD & O2 Sat Independence with exercise equipment Exercise tolerated well No report of concerns or symptoms today Strength training completed today  Goals Unmet:  Not Applicable  Comments: Pt able to follow exercise prescription today without complaint.  Will continue to monitor for progression.   Dr. Emily Filbert is Medical Director for [place].  Dr. Ottie Glazier is Medical Director for [facility] Pulmonary Rehabilitation.

## 2022-04-03 NOTE — Patient Instructions (Signed)
Discharge Patient Instructions  Patient Details  Name: Jesse Curtis MRN: [id number] Date of Birth: May 27, 1946 Referring Provider:  Tracie Harrier, MD   Number of Visits: 35  Reason for Discharge:  Patient reached a stable level of exercise. Patient independent in their exercise. Patient has met program and personal goals.  Smoking History:  Social History   Tobacco Use  Smoking Status Never  Smokeless Tobacco Never    Diagnosis:  S/P CABG x 4  Initial Exercise Prescription:  Initial Exercise Prescription - 01/20/22 1600       Date of Initial Exercise RX and Referring Provider   Date 01/20/22    Referring Provider Nehemiah Massed      Oxygen   Maintain Oxygen Saturation 88% or higher      Treadmill   MPH 1.5    Grade 0    Minutes 15    METs 2      NuStep   Level 2    SPM 80    Minutes 15    METs 1.9      Recumbant Elliptical   Level 1    RPM 50    Minutes 15    METs 1.9      REL-XR   Level 2    Speed 50    Minutes 15    METs 1.9      Track   Laps 15    Minutes 15    METs 1.9      Prescription Details   Frequency (times per week) 3    Duration Progress to 30 minutes of continuous aerobic without signs/symptoms of physical distress      Intensity   THRR 40-80% of Max Heartrate 107-132    Ratings of Perceived Exertion 11-13    Perceived Dyspnea 0-4      Progression   Progression Continue to progress workloads to maintain intensity without signs/symptoms of physical distress.      Resistance Training   Training Prescription Yes    Weight 3 lb    Reps 10-15             Discharge Exercise Prescription (Final Exercise Prescription Changes):  Exercise Prescription Changes - 04/02/22 0800       Response to Exercise   Blood Pressure (Admit) 116/64    Blood Pressure (Exit) 102/60    Heart Rate (Admit) 62 bpm    Heart Rate (Exercise) 112 bpm    Heart Rate (Exit) 79 bpm    Rating of Perceived Exertion (Exercise) 12    Symptoms none     Duration Continue with 30 min of aerobic exercise without signs/symptoms of physical distress.    Intensity THRR unchanged      Progression   Progression Continue to progress workloads to maintain intensity without signs/symptoms of physical distress.    Average METs 3.38      Resistance Training   Training Prescription Yes    Weight 4 lb    Reps 10-15      Interval Training   Interval Training No      NuStep   Level 3    Minutes 15    METs 3      Recumbant Elliptical   Level 3    Minutes 15    METs 1.6      REL-XR   Level 3    Minutes 15    METs 5.1      Home Exercise Plan   Plans to continue exercise at Home (  comment)   walking, wants to join gym   Frequency Add 3 additional days to program exercise sessions.    Initial Home Exercises Provided 02/19/22      Oxygen   Maintain Oxygen Saturation 88% or higher             Functional Capacity:  6 Minute Walk     Row Name 01/20/22 1604         6 Minute Walk   Distance 888 feet     Walk Time 6 minutes     # of Rest Breaks 0     MPH 1.7     METS 1.9     RPE 11     Perceived Dyspnea  0     VO2 Peak 6.63     Symptoms Yes (comment)     Comments leg fatigue (general)     Resting HR 82 bpm     Resting BP 104/54     Resting Oxygen Saturation  98 %     Exercise Oxygen Saturation  during 6 min walk 97 %     Max Ex. HR 103 bpm     Max Ex. BP 138/64     2 Minute Post BP 124/60             Post 6MWT not completed due to foot pain.    Nutrition & Weight - Outcomes:  Pre Biometrics - 01/20/22 1613       Pre Biometrics   Height 5' 5.75" (1.67 m)    Weight 171 lb 3.2 oz (77.7 kg)    BMI (Calculated) 27.84    Single Leg Stand 19.78 seconds             Post Biometrics - 04/03/22 1707        Post  Biometrics   Height 5' 5.75" (1.67 m)    Weight 172 lb 3.2 oz (78.1 kg)    BMI (Calculated) 28.01             Nutrition:  Nutrition Therapy & Goals - 02/03/22 1640       Nutrition  Therapy   RD appointment deferred Yes   Pt goes to diabetes clinic - would not like to meet with RD at this time, will follow up.             Goals reviewed with patient; copy given to patient.

## 2022-04-07 DIAGNOSIS — M722 Plantar fascial fibromatosis: Secondary | ICD-10-CM | POA: Diagnosis not present

## 2022-04-07 DIAGNOSIS — M216X2 Other acquired deformities of left foot: Secondary | ICD-10-CM | POA: Diagnosis not present

## 2022-04-07 DIAGNOSIS — M216X1 Other acquired deformities of right foot: Secondary | ICD-10-CM | POA: Diagnosis not present

## 2022-04-07 DIAGNOSIS — E1142 Type 2 diabetes mellitus with diabetic polyneuropathy: Secondary | ICD-10-CM | POA: Diagnosis not present

## 2022-04-07 DIAGNOSIS — M79672 Pain in left foot: Secondary | ICD-10-CM | POA: Diagnosis not present

## 2022-04-07 DIAGNOSIS — Z951 Presence of aortocoronary bypass graft: Secondary | ICD-10-CM

## 2022-04-07 DIAGNOSIS — M7732 Calcaneal spur, left foot: Secondary | ICD-10-CM | POA: Diagnosis not present

## 2022-04-07 NOTE — Progress Notes (Signed)
Daily Session Note  Patient Details  Name: Jesse Curtis MRN: [id number] Date of Birth: October 13, 1946 Referring Provider:   Flowsheet Row Cardiac Rehab from 01/20/2022 in [facility]  Referring Provider Nehemiah Massed       Encounter Date: 04/07/2022  Check In:  Session Check In - 04/07/22 1603       Check-In   Supervising physician immediately available to respond to emergencies See telemetry face sheet for immediately available ER MD    Location [facility] & [facility]    Staff Present Justin Mend, Sharren Bridge, MS, ASCM CEP, Exercise Physiologist;Erven Ramson Rosalia Hammers, MPA, RN    Virtual Visit No    Medication changes reported     No    Fall or balance concerns reported    No    Tobacco Cessation No Change    Warm-up and Cool-down Performed on first and last piece of equipment    Resistance Training Performed Yes    VAD Patient? No    PAD/SET Patient? No      Pain Assessment   Currently in Pain? No/denies                Social History   Tobacco Use  Smoking Status Never  Smokeless Tobacco Never    Goals Met:  Independence with exercise equipment Exercise tolerated well No report of concerns or symptoms today Strength training completed today  Goals Unmet:  Not Applicable  Comments:  Teofil graduated today from  rehab with 36 sessions completed.  Details of the patient's exercise prescription and what He needs to do in order to continue the prescription and progress were discussed with patient.  Patient was given a copy of prescription and goals.  Patient verbalized understanding.  Tollie plans to continue to exercise by walking at home and joining a gym.    Dr. Emily Filbert is Medical Director for [facility].  Dr. Ottie Glazier is Medical Director for [facility].

## 2022-04-07 NOTE — Progress Notes (Signed)
Discharge Progress Report  Patient Details  Name: Jesse Curtis MRN: [id number] Date of Birth: 1946/04/15 Referring Provider:   Flowsheet Row Cardiac Rehab from 01/20/2022 in [facility]  Referring Provider Nehemiah Massed        Number of Visits: 71  Reason for Discharge:  Patient reached a stable level of exercise. Patient independent in their exercise. Patient has met program and personal goals.  Smoking History:  Social History   Tobacco Use  Smoking Status Never  Smokeless Tobacco Never    Diagnosis:  S/P CABG x 4  ADL UCSD:   Initial Exercise Prescription:  Initial Exercise Prescription - 01/20/22 1600       Date of Initial Exercise RX and Referring Provider   Date 01/20/22    Referring Provider Nehemiah Massed      Oxygen   Maintain Oxygen Saturation 88% or higher      Treadmill   MPH 1.5    Grade 0    Minutes 15    METs 2      NuStep   Level 2    SPM 80    Minutes 15    METs 1.9      Recumbant Elliptical   Level 1    RPM 50    Minutes 15    METs 1.9      REL-XR   Level 2    Speed 50    Minutes 15    METs 1.9      Track   Laps 15    Minutes 15    METs 1.9      Prescription Details   Frequency (times per week) 3    Duration Progress to 30 minutes of continuous aerobic without signs/symptoms of physical distress      Intensity   THRR 40-80% of Max Heartrate 107-132    Ratings of Perceived Exertion 11-13    Perceived Dyspnea 0-4      Progression   Progression Continue to progress workloads to maintain intensity without signs/symptoms of physical distress.      Resistance Training   Training Prescription Yes    Weight 3 lb    Reps 10-15             Discharge Exercise Prescription (Final Exercise Prescription Changes):  Exercise Prescription Changes - 04/02/22 0800       Response to Exercise   Blood Pressure (Admit) 116/64    Blood Pressure (Exit) 102/60    Heart Rate (Admit) 62 bpm    Heart Rate  (Exercise) 112 bpm    Heart Rate (Exit) 79 bpm    Rating of Perceived Exertion (Exercise) 12    Symptoms none    Duration Continue with 30 min of aerobic exercise without signs/symptoms of physical distress.    Intensity THRR unchanged      Progression   Progression Continue to progress workloads to maintain intensity without signs/symptoms of physical distress.    Average METs 3.38      Resistance Training   Training Prescription Yes    Weight 4 lb    Reps 10-15      Interval Training   Interval Training No      NuStep   Level 3    Minutes 15    METs 3      Recumbant Elliptical   Level 3    Minutes 15    METs 1.6      REL-XR   Level 3  Minutes 15    METs 5.1      Home Exercise Plan   Plans to continue exercise at Home (comment)   walking, wants to join gym   Frequency Add 3 additional days to program exercise sessions.    Initial Home Exercises Provided 02/19/22      Oxygen   Maintain Oxygen Saturation 88% or higher             Functional Capacity:  6 Minute Walk     Row Name 01/20/22 1604         6 Minute Walk   Distance 888 feet     Walk Time 6 minutes     # of Rest Breaks 0     MPH 1.7     METS 1.9     RPE 11     Perceived Dyspnea  0     VO2 Peak 6.63     Symptoms Yes (comment)     Comments leg fatigue (general)     Resting HR 82 bpm     Resting BP 104/54     Resting Oxygen Saturation  98 %     Exercise Oxygen Saturation  during 6 min walk 97 %     Max Ex. HR 103 bpm     Max Ex. BP 138/64     2 Minute Post BP 124/60              Psychological, QOL, Others - Outcomes: PHQ 2/9:    04/03/2022    7:23 AM 02/13/2022    4:28 PM 01/20/2022    4:14 PM  Depression screen PHQ 2/9  Decreased Interest 0 1 1  Down, Depressed, Hopeless 0 0 0  PHQ - 2 Score 0 1 1  Altered sleeping 0 1 1  Tired, decreased energy 0 1 2  Change in appetite $RemoveBef'1 1 1  'nftfQKrYqN$ Feeling bad or failure about yourself  0 0 0  Trouble concentrating 1 0 1  Moving slowly or  fidgety/restless 0 0 1  Suicidal thoughts 0 0 0  PHQ-9 Score $RemoveBef'2 4 7  'giZnYpsFkY$ Difficult doing work/chores Not difficult at all Not difficult at all Not difficult at all    Quality of Life:  Quality of Life - 04/03/22 0727       Quality of Life Scores   Health/Function Pre 20.1 %    Health/Function Post 24.8 %    Health/Function % Change 23.38 %    Socioeconomic Pre 22.86 %    Socioeconomic Post 24.75 %    Socioeconomic % Change  8.27 %    Psych/Spiritual Pre 24.64 %    Psych/Spiritual Post 25.71 %    Psych/Spiritual % Change 4.34 %    Family Pre 21.2 %    Family Post 22.5 %    Family % Change 6.13 %    GLOBAL Pre 21.76 %    GLOBAL Post 24.64 %    GLOBAL % Change 13.24 %              Nutrition & Weight - Outcomes:  Pre Biometrics - 01/20/22 1613       Pre Biometrics   Height 5' 5.75" (1.67 m)    Weight 171 lb 3.2 oz (77.7 kg)    BMI (Calculated) 27.84    Single Leg Stand 19.78 seconds             Post Biometrics - 04/03/22 1707        Post  Biometrics  Height 5' 5.75" (1.67 m)    Weight 172 lb 3.2 oz (78.1 kg)    BMI (Calculated) 28.01             Nutrition:  Nutrition Therapy & Goals - 02/03/22 1640       Nutrition Therapy   RD appointment deferred Yes   Pt goes to diabetes clinic - would not like to meet with RD at this time, will follow up.            Nutrition Discharge:   Education Questionnaire Score:  Knowledge Questionnaire Score - 04/03/22 0726       Knowledge Questionnaire Score   Pre Score 22/26    Post Score 25/26             Goals reviewed with patient; copy given to patient.

## 2022-04-07 NOTE — Progress Notes (Signed)
Cardiac Individual Treatment Plan  Patient Details  Name: Jesse Curtis MRN: [id number] Date of Birth: Jun 01, 1946 Referring Provider:   Flowsheet Row Cardiac Rehab from 01/20/2022 in [facility] Cardiac and Pulmonary Rehab  Referring Provider Nehemiah Massed       Initial Encounter Date:  Flowsheet Row Cardiac Rehab from 01/20/2022 in [facility] Cardiac and Pulmonary Rehab  Date 01/20/22       Visit Diagnosis: S/P CABG x 4  Patient's Home Medications on Admission:  Current Outpatient Medications:    acetaminophen (TYLENOL) 650 MG CR tablet, Take 1,300 mg by mouth every 8 (eight) hours as needed for pain., Disp: , Rfl:    Ascorbic Acid (VITAMIN C PO), Take 1,500 mg by mouth daily., Disp: , Rfl:    aspirin 81 MG EC tablet, Take by mouth., Disp: , Rfl:    atorvastatin (LIPITOR) 40 MG tablet, Take 40 mg by mouth daily., Disp: , Rfl:    Cholecalciferol (VITAMIN D3) 125 MCG (5000 UT) CAPS, Take 5,000 Units by mouth daily., Disp: , Rfl:    cyanocobalamin 1000 MCG tablet, Take 1 tablet by mouth daily., Disp: , Rfl:    ferrous sulfate 325 (65 FE) MG tablet, Take 325 mg by mouth daily with breakfast., Disp: , Rfl:    finasteride (PROSCAR) 5 MG tablet, Take 5 mg by mouth daily., Disp: , Rfl:    glimepiride (AMARYL) 4 MG tablet, Take 4 mg by mouth daily with breakfast. (Patient not taking: Reported on 01/10/2022), Disp: , Rfl:    insulin isophane & regular human KwikPen (NOVOLIN 70/30 KWIKPEN) (70-30) 100 UNIT/ML KwikPen, INJECT 28 UNITS SUBCUTANEOUSLY IN THE MORNING AND 20 UNITS AT NIGHT, Disp: , Rfl:    lisinopril (ZESTRIL) 5 MG tablet, Take 5 mg by mouth daily. (Patient not taking: Reported on 01/10/2022), Disp: , Rfl:    metFORMIN (GLUCOPHAGE) 500 MG tablet, Take 1,000 mg by mouth 2 (two) times daily., Disp: , Rfl:    metoprolol tartrate (LOPRESSOR) 25 MG tablet, Take by mouth., Disp: , Rfl:    Multiple Vitamin (MULTIVITAMIN WITH MINERALS) TABS tablet, Take 1 tablet by mouth daily., Disp: , Rfl:    Multiple  Vitamins-Minerals (EYE VITAMINS & MINERALS PO), Take 1 tablet by mouth daily., Disp: , Rfl:    Omega-3 Fatty Acids (FISH OIL) 1000 MG CAPS, Take 1,000 mg by mouth daily., Disp: , Rfl:    omeprazole (PRILOSEC OTC) 20 MG tablet, Take 20 mg by mouth every other day. In the morning., Disp: , Rfl:    pioglitazone (ACTOS) 30 MG tablet, Take 30 mg by mouth daily. (Patient not taking: Reported on 01/10/2022), Disp: , Rfl:    vitamin B-12 (CYANOCOBALAMIN) 1000 MCG tablet, Take 1,000 mcg by mouth daily. (Patient not taking: Reported on 01/10/2022), Disp: , Rfl:    zinc gluconate 50 MG tablet, Take 50 mg by mouth daily. 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  Exercise Target Goals: Exercise Program Goal: Individual exercise prescription set using results from initial 6 min walk test and THRR while considering  patient's activity barriers and safety.   Exercise Prescription Goal: Initial exercise prescription builds to 30-45 minutes a day of aerobic activity, 2-3 days per week.  Home exercise guidelines will be given to patient during program as part of exercise prescription that the participant will acknowledge.   Education: Aerobic Exercise: - Group verbal and visual presentation on the components of exercise prescription. Introduces F.I.T.T principle from ACSM for exercise prescriptions.  Reviews F.I.T.T. principles of aerobic exercise including progression. Written material given at graduation. Flowsheet Row Cardiac Rehab from 03/26/2022  in [facility] Cardiac and Pulmonary Rehab  Date 03/05/22  Educator [place]  Instruction Review Code 1- Verbalizes Understanding       Education: Resistance Exercise: - Group verbal and visual presentation on the components of exercise prescription. Introduces F.I.T.T principle from ACSM for exercise prescriptions  Reviews F.I.T.T. principles of resistance exercise including progression. Written material given at graduation. Flowsheet Row Cardiac Rehab from 03/26/2022 in [facility] Cardiac and Pulmonary Rehab  Date 03/12/22  Educator [facility]  Instruction Review Code 1- Verbalizes Understanding        Education: Exercise & Equipment Safety: - Individual verbal instruction and demonstration of equipment use and safety with use of the equipment. Flowsheet Row Cardiac Rehab from 03/26/2022 in [facility] Cardiac and Pulmonary Rehab  Date 01/20/22  Educator [place]  Instruction Review Code 1- Verbalizes Understanding       Education: Exercise Physiology & General Exercise Guidelines: - Group verbal and written instruction with models to review the exercise physiology of the cardiovascular system and associated critical values. Provides general exercise guidelines with specific guidelines to those with heart or lung disease.  Flowsheet Row Cardiac Rehab from 03/26/2022 in [facility] Cardiac and Pulmonary Rehab  Education need identified 01/20/22  Date 02/26/22  Educator [place]  Instruction Review Code 1- Verbalizes Understanding       Education: Flexibility, Balance, Mind/Body Relaxation: - Group verbal and visual presentation with interactive activity on the components of exercise prescription. Introduces F.I.T.T principle from ACSM for exercise prescriptions. Reviews F.I.T.T. principles of flexibility and balance exercise training including progression. Also discusses the mind body connection.  Reviews various relaxation techniques to help reduce and manage stress (i.e. Deep breathing, progressive muscle relaxation, and  visualization). Balance handout provided to take home. Written material given at graduation. Flowsheet Row Cardiac Rehab from 03/26/2022 in [facility] Cardiac and Pulmonary Rehab  Date 03/19/22  Educator [place]  Instruction Review Code 1- Verbalizes Understanding       Activity Barriers & Risk Stratification:  Activity Barriers & Cardiac Risk Stratification - 01/10/22 1304       Activity Barriers & Cardiac Risk Stratification   Activity Barriers None    Cardiac Risk Stratification High             6 Minute Walk:  6 Minute Walk     Row Name 01/20/22 1604         6 Minute Walk   Distance 888 feet     Walk Time 6 minutes     # of Rest Breaks 0     MPH 1.7     METS 1.9     RPE 11     Perceived Dyspnea  0     VO2 Peak 6.63     Symptoms Yes (comment)     Comments leg fatigue (general)     Resting  HR 82 bpm     Resting BP 104/54     Resting Oxygen Saturation  98 %     Exercise Oxygen Saturation  during 6 min walk 97 %     Max Ex. HR 103 bpm     Max Ex. BP 138/64     2 Minute Post BP 124/60              Oxygen Initial Assessment:   Oxygen Re-Evaluation:   Oxygen Discharge (Final Oxygen Re-Evaluation):   Initial Exercise Prescription:  Initial Exercise Prescription - 01/20/22 1600       Date of Initial Exercise RX and Referring Provider   Date 01/20/22    Referring Provider Gwen Pounds      Oxygen   Maintain Oxygen Saturation 88% or higher      Treadmill   MPH 1.5    Grade 0    Minutes 15    METs 2      NuStep   Level 2    SPM 80    Minutes 15    METs 1.9      Recumbant Elliptical   Level 1    RPM 50    Minutes 15    METs 1.9      REL-XR   Level 2    Speed 50    Minutes 15    METs 1.9      Track   Laps 15    Minutes 15    METs 1.9      Prescription Details   Frequency (times per week) 3    Duration Progress to 30 minutes of continuous aerobic without signs/symptoms of physical distress      Intensity   THRR 40-80% of Max Heartrate  107-132    Ratings of Perceived Exertion 11-13    Perceived Dyspnea 0-4      Progression   Progression Continue to progress workloads to maintain intensity without signs/symptoms of physical distress.      Resistance Training   Training Prescription Yes    Weight 3 lb    Reps 10-15             Perform Capillary Blood Glucose checks as needed.  Exercise Prescription Changes:   Exercise Prescription Changes     Row Name 01/20/22 1600 02/03/22 1800 02/18/22 1200 02/20/22 0700 03/04/22 0700     Response to Exercise   Blood Pressure (Admit) 104/54 128/62 124/62 -- 118/62   Blood Pressure (Exercise) 138/64 124/62 144/76 -- --   Blood Pressure (Exit) 124/60 116/60 118/60 -- 112/58   Heart Rate (Admit) 82 bpm 95 bpm 59 bpm -- 82 bpm   Heart Rate (Exercise) 103 bpm 112 bpm 114 bpm -- 113 bpm   Heart Rate (Exit) 89 bpm 102 bpm 104 bpm -- 96 bpm   Oxygen Saturation (Admit) 98 % -- -- -- --   Oxygen Saturation (Exercise) 97 % -- -- -- --   Rating of Perceived Exertion (Exercise) 11 12 12  -- 12   Perceived Dyspnea (Exercise) 0 -- -- -- --   Symptoms leg fatigue none none -- none   Comments -- 3rd full day of exercise -- -- --   Duration -- Progress to 30 minutes of  aerobic without signs/symptoms of physical distress Continue with 30 min of aerobic exercise without signs/symptoms of physical distress. -- Continue with 30 min of aerobic exercise without signs/symptoms of physical distress.   Intensity -- THRR unchanged THRR unchanged -- THRR  unchanged     Progression   Progression -- Continue to progress workloads to maintain intensity without signs/symptoms of physical distress. Continue to progress workloads to maintain intensity without signs/symptoms of physical distress. -- Continue to progress workloads to maintain intensity without signs/symptoms of physical distress.   Average METs -- 1.9 2.2 -- 2.56     Resistance Training   Training Prescription -- Yes Yes -- Yes   Weight  -- 3 lb 3 lb -- 4 lb   Reps -- 10-15 10-15 -- 10-15     Interval Training   Interval Training -- No No -- No     Recumbant Elliptical   Level -- -- -- -- 2   Minutes -- -- -- -- 15   METs -- -- -- -- 1.7     REL-XR   Level -- 2 2 -- 2   Minutes -- 15 15 -- 15   METs -- 2.5 2 -- 3.4     T5 Nustep   Level -- 1 -- -- --   Minutes -- 15 -- -- --   METs -- 1.4 -- -- --     Track   Laps -- 23 25 -- 29   Minutes -- 15 15 -- 15   METs -- 2.25 2.36 -- 2.58     Home Exercise Plan   Plans to continue exercise at -- -- -- Home (comment)  walking, wants to join gym Home (comment)  walking, wants to join gym   Frequency -- -- -- Add 3 additional days to program exercise sessions. Add 3 additional days to program exercise sessions.   Initial Home Exercises Provided -- -- -- 02/19/22 02/19/22     Oxygen   Maintain Oxygen Saturation -- 88% or higher 88% or higher 88% or higher --    Row Name 03/17/22 0900 04/02/22 0800           Response to Exercise   Blood Pressure (Admit) 124/58 116/64      Blood Pressure (Exit) 108/64 102/60      Heart Rate (Admit) 55 bpm 62 bpm      Heart Rate (Exercise) 115 bpm 112 bpm      Heart Rate (Exit) 53 bpm 79 bpm      Rating of Perceived Exertion (Exercise) 12 12      Symptoms none none      Duration Continue with 30 min of aerobic exercise without signs/symptoms of physical distress. Continue with 30 min of aerobic exercise without signs/symptoms of physical distress.      Intensity THRR unchanged THRR unchanged        Progression   Progression Continue to progress workloads to maintain intensity without signs/symptoms of physical distress. Continue to progress workloads to maintain intensity without signs/symptoms of physical distress.      Average METs 2.6 3.38        Resistance Training   Training Prescription Yes Yes      Weight 4 lb 4 lb      Reps 10-15 10-15        Interval Training   Interval Training No No        NuStep   Level 3 3       Minutes 15 15      METs 2 3        Recumbant Elliptical   Level 1.6 3      Minutes 15 15      METs 1.6 1.6  REL-XR   Level 2 3      Minutes 15 15      METs 4.6 5.1        Track   Laps 30 --      Minutes 15 --      METs 2.63 --        Home Exercise Plan   Plans to continue exercise at Home (comment)  walking, wants to join gym Home (comment)  walking, wants to join gym      Frequency Add 3 additional days to program exercise sessions. Add 3 additional days to program exercise sessions.      Initial Home Exercises Provided 02/19/22 02/19/22        Oxygen   Maintain Oxygen Saturation 88% or higher 88% or higher               Exercise Comments:   Exercise Comments     Row Name 01/27/22 1617 04/07/22 1605         Exercise Comments First full day of exercise!  Patient was oriented to gym and equipment including functions, settings, policies, and procedures.  Patient's individual exercise prescription and treatment plan were reviewed.  All starting workloads were established based on the results of the 6 minute walk test done at initial orientation visit.  The plan for exercise progression was also introduced and progression will be customized based on patient's performance and goals. Finas graduated today from  rehab with 36 sessions completed.  Details of the patient's exercise prescription and what He needs to do in order to continue the prescription and progress were discussed with patient.  Patient was given a copy of prescription and goals.  Patient verbalized understanding.  [place] plans to continue to exercise by walking at home and joining a gym.               Exercise Goals and Review:   Exercise Goals     Row Name 01/20/22 1613             Exercise Goals   Increase Physical Activity Yes       Intervention Provide advice, education, support and counseling about physical activity/exercise needs.;Develop an individualized exercise  prescription for aerobic and resistive training based on initial evaluation findings, risk stratification, comorbidities and participant's personal goals.       Expected Outcomes Short Term: Attend rehab on a regular basis to increase amount of physical activity.;Long Term: Add in home exercise to make exercise part of routine and to increase amount of physical activity.;Long Term: Exercising regularly at least 3-5 days a week.       Increase Strength and Stamina Yes       Intervention Provide advice, education, support and counseling about physical activity/exercise needs.;Develop an individualized exercise prescription for aerobic and resistive training based on initial evaluation findings, risk stratification, comorbidities and participant's personal goals.       Expected Outcomes Short Term: Increase workloads from initial exercise prescription for resistance, speed, and METs.;Short Term: Perform resistance training exercises routinely during rehab and add in resistance training at home;Long Term: Improve cardiorespiratory fitness, muscular endurance and strength as measured by increased METs and functional capacity (6MWT)       Able to understand and use rate of perceived exertion (RPE) scale Yes       Intervention Provide education and explanation on how to use RPE scale       Expected Outcomes Short Term: Able to use RPE daily  in rehab to express subjective intensity level;Long Term:  Able to use RPE to guide intensity level when exercising independently       Able to understand and use Dyspnea scale Yes       Intervention Provide education and explanation on how to use Dyspnea scale       Expected Outcomes Short Term: Able to use Dyspnea scale daily in rehab to express subjective sense of shortness of breath during exertion;Long Term: Able to use Dyspnea scale to guide intensity level when exercising independently       Knowledge and understanding of Target Heart Rate Range (THRR) Yes        Intervention Provide education and explanation of THRR including how the numbers were predicted and where they are located for reference       Expected Outcomes Short Term: Able to state/look up THRR;Short Term: Able to use daily as guideline for intensity in rehab;Long Term: Able to use THRR to govern intensity when exercising independently       Able to check pulse independently Yes       Intervention Provide education and demonstration on how to check pulse in carotid and radial arteries.;Review the importance of being able to check your own pulse for safety during independent exercise       Expected Outcomes Short Term: Able to explain why pulse checking is important during independent exercise;Long Term: Able to check pulse independently and accurately       Understanding of Exercise Prescription Yes       Intervention Provide education, explanation, and written materials on patient's individual exercise prescription       Expected Outcomes Short Term: Able to explain program exercise prescription;Long Term: Able to explain home exercise prescription to exercise independently                Exercise Goals Re-Evaluation :  Exercise Goals Re-Evaluation     Row Name 01/27/22 1617 02/03/22 1829 02/18/22 1245 02/20/22 0744 03/04/22 0715     Exercise Goal Re-Evaluation   Exercise Goals Review Increase Physical Activity;Able to understand and use rate of perceived exertion (RPE) scale;Knowledge and understanding of Target Heart Rate Range (THRR);Understanding of Exercise Prescription;Able to understand and use Dyspnea scale;Able to check pulse independently;Increase Strength and Stamina Increase Physical Activity;Increase Strength and Stamina Increase Physical Activity;Increase Strength and Stamina Increase Physical Activity;Increase Strength and Stamina;Understanding of Exercise Prescription Increase Physical Activity;Increase Strength and Stamina;Understanding of Exercise Prescription    Comments Reviewed RPE and dyspnea scales, THR and program prescription with pt today.  Pt voiced understanding and was given a copy of goals to take home. Jesse Curtis is doing well for the first couple of sessions that he has been here. He has already reached 23 laps on the track and is tolerating the XR at level 2. He has hit his [place] 2 of the 3 sessions that he has been here. Will continue to monitor as he progresses. Jesse Curtis is progressing well and has reached 25 laps on the track.  He reaches THR range during exercise. Reviewed home exercise with pt today.  Pt plans to walk for exercise. 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  Reviewed THR, pulse, RPE, sign and symptoms, pulse oximetery and when to call 911 or MD.  Also discussed weather considerations and indoor options.  Pt voiced understanding. Jesse Curtis is doing well in rehab.  He is up to 29 laps on the track and 3.4 METs on the  XR.  We will continue to monitor his progress.   Expected Outcomes Short: Use RPE daily to regulate intensity. Long: Follow program prescription in THR. Short: Continue to increase laps on track as tolerated Long: Continue to increase overall MET level Short: maintain consistent attendance  Long:  continue to build stamina Short: Start watching HR during exercise Long: [place] to exercise independently at home at appropriate prescription Short: Bump up seated workloads Long: Continue to improve stamina.    [place] Name 03/17/22 4239 03/26/22 1703 04/02/22 0829         Exercise Goal Re-Evaluation   Exercise Goals Review Increase Physical Activity;Increase Strength and Stamina;Understanding of Exercise Prescription Increase Physical Activity;Increase Strength and Stamina;Understanding of Exercise Prescription Increase Physical Activity;Increase Strength and Stamina;Understanding of Exercise Prescription     Comments Jesse Curtis continues to do well. He did start experiencing foot pain from what he thinks was caused from his  stress test and has lessened his walking at rehab. He tried the T4 Nustep for the first time and was able to work at level 3. Prior to his foot pain, he increased to 30 laps on the track. His METS reached up to 4.6 METS on the XR. Will continue to monitor. Jesse Curtis is getting ready to graduate. He is still having foot pain and at this time is not able to complete his post 6MWT. He will let us know if his pain subsides and feels like he would be able to do it. Overall, he feels a lot stronger and says he feels more better with his balance. After he graduates, he plans to use [employer] which he already has a membership at as long as his foot is in less pain. He will also plan to get back to walking at home. He continues to watch his HR at home and is pleased with the program. Jesse Curtis is doing well in rehab. He increased his average MET level from 2.6 to 3.38 METs. He has tolerated using 4 lb hand weights for resistance training. He also improved improved to level 3 on the recumbent elliptical. We will continue to monitor Larry's progress until he graduates from the program.     Expected Outcomes Short: Increase level on XR and continue to increase MET level Long: Continue to increase overall MET levels Short: Graduate Long: Continue to exercise independently Short: Graduate Long: Continue to exercise independently              Discharge Exercise Prescription (Final Exercise Prescription Changes):  Exercise Prescription Changes - 04/02/22 0800       Response to Exercise   Blood Pressure (Admit) 116/64    Blood Pressure (Exit) 102/60    Heart Rate (Admit) 62 bpm    Heart Rate (Exercise) 112 bpm    Heart Rate (Exit) 79 bpm    Rating of Perceived Exertion (Exercise) 12    Symptoms none    Duration Continue with 30 min of aerobic exercise without signs/symptoms of physical distress.    Intensity THRR unchanged      Progression   Progression Continue to progress workloads to maintain intensity without  signs/symptoms of physical distress.    Average METs 3.38      Resistance Training   Training Prescription Yes    Weight 4 lb    Reps 10-15      Interval Training   Interval Training No      NuStep   Level 3    Minutes 15    METs  3      Recumbant Elliptical   Level 3    Minutes 15    METs 1.6      REL-XR   Level 3    Minutes 15    METs 5.1      Home Exercise Plan   Plans to continue exercise at Home (comment)   walking, wants to join gym   Frequency Add 3 additional days to program exercise sessions.    Initial Home Exercises Provided 02/19/22      Oxygen   Maintain Oxygen Saturation 88% or higher             Nutrition:  Target Goals: Understanding of nutrition guidelines, daily intake of sodium '1500mg'$ , cholesterol '200mg'$ , calories 30% from fat and 7% or less from saturated fats, daily to have 5 or more servings of fruits and vegetables.  Education: All About Nutrition: -Group instruction provided by verbal, written material, interactive activities, discussions, models, and posters to present general guidelines for heart healthy nutrition including fat, fiber, MyPlate, the role of sodium in heart healthy nutrition, utilization of the nutrition label, and utilization of this knowledge for meal planning. Follow up email sent as well. Written material given at graduation. Flowsheet Row Cardiac Rehab from 03/26/2022 in [facility] Cardiac and Pulmonary Rehab  Education need identified 01/20/22  Date 03/26/22  Educator [place]  Instruction Review Code 1- Verbalizes Understanding       Biometrics:  Pre Biometrics - 01/20/22 1613       Pre Biometrics   Height 5' 5.75" (1.67 m)    Weight 171 lb 3.2 oz (77.7 kg)    BMI (Calculated) 27.84    Single Leg Stand 19.78 seconds             Post Biometrics - 04/03/22 1707        Post  Biometrics   Height 5' 5.75" (1.67 m)    Weight 172 lb 3.2 oz (78.1 kg)    BMI (Calculated) 28.01             Nutrition Therapy  Plan and Nutrition Goals:  Nutrition Therapy & Goals - 02/03/22 1640       Nutrition Therapy   RD appointment deferred Yes   Pt goes to diabetes clinic - would not like to meet with RD at this time, will follow up.            Nutrition Assessments:  MEDIFICTS Score Key: ?70 Need to make dietary changes  40-70 Heart Healthy Diet ? 40 Therapeutic Level Cholesterol Diet  Flowsheet Row Cardiac Rehab from 04/02/2022 in [facility] Cardiac and Pulmonary Rehab  Picture Your Plate Total Score on Admission 65  Picture Your Plate Total Score on Discharge 70      Picture Your Plate Scores: <17 Unhealthy dietary pattern with much room for improvement. 41-50 Dietary pattern unlikely to meet recommendations for good health and room for improvement. 51-60 More healthful dietary pattern, with some room for improvement.  >60 Healthy dietary pattern, although there may be some specific behaviors that could be improved.    Nutrition Goals Re-Evaluation:  Nutrition Goals Re-Evaluation     [place] Name 02/13/22 1626 03/12/22 1623 03/26/22 1706         Goals   Current Weight 168 lb (76.2 kg) 169 lb (76.7 kg) --     Nutrition Goal Eat smaller portions. Lose more weight. --     Comment Patient has deferred RD. He would like to limit his snacks.  He sometimes snacks on cookies, crackers and dessert type foods. Patient has deferred RD. Jesse Curtis feels like he needs to cut back on snacking to improve his weight. He is going to follow his hunger cues and is going to try to snack more healthy. Jesse Curtis has still declined an RD session. He is staying for the nutrition education today with the RD.     Expected Outcome Shot: cut back on sweets. Long: maintain a diet that pertains to him. Short: eat healthier snacks. Long: adhere a diet that can reduce his weight and maintain. --              Nutrition Goals Discharge (Final Nutrition Goals Re-Evaluation):  Nutrition Goals Re-Evaluation - 03/26/22 1706        Goals   Comment Jesse Curtis has still declined an RD session. He is staying for the nutrition education today with the RD.             Psychosocial: Target Goals: Acknowledge presence or absence of significant depression and/or stress, maximize coping skills, provide positive support system. Participant is able to verbalize types and ability to use techniques and skills needed for reducing stress and depression.   Education: Stress, Anxiety, and Depression - Group verbal and visual presentation to define topics covered.  Reviews how body is impacted by stress, anxiety, and depression.  Also discusses healthy ways to reduce stress and to treat/manage anxiety and depression.  Written material given at graduation. Flowsheet Row Cardiac Rehab from 03/26/2022 in [facility] Cardiac and Pul[facility] Rehab  Date 02/19/22  Educator AS  Instruction Review Code 1- Verbalizes Understanding       Education: Sleep Hygiene -Provides group verbal and written instruction about how sleep can affect your health.  Define sleep hygiene, discuss sleep cycles and impact of sleep habits. Review good sleep hygiene tips.    Initial Review & Psychosocial Screening:  Initial Psych Review & Screening - 01/10/22 1309       Initial Review   Current issues with None Identified      Family Dynamics   Good Support System? Yes   wife, son, sister     Barriers   Psychosocial barriers to participate in program There are no identifiable barriers or psychosocial needs.      Screening Interventions   Interventions Encouraged to exercise;Provide feedback about the scores to participant    Expected Outcomes Short Term goal: Utilizing psychosocial counselor, staff and physician to assist with identification of specific Stressors or current issues interfering with healing process. Setting desired goal for each stressor or current issue identified.;Long Term Goal: Stressors or current issues are controlled or eliminated.;Short Term  goal: Identification and review with participant of any Quality of Life or Depression concerns found by scoring the questionnaire.;Long Term goal: The participant improves quality of Life and PHQ9 Scores as seen by post scores and/or verbalization of changes             Quality of Life Scores:   Quality of Life - 04/03/22 0727       Quality of Life Scores   Health/Function Pre 20.1 %    Health/Function Post 24.8 %    Health/Function % Change 23.38 %    Socioeconomic Pre 22.86 %    Socioeconomic Post 24.75 %    Socioeconomic % Change  8.27 %    Psych/Spiritual Pre 24.64 %    Psych/Spiritual Post 25.71 %    Psych/Spiritual % Change 4.34 %    Family Pre  21.2 %    Family Post 22.5 %    Family % Change 6.13 %    GLOBAL Pre 21.76 %    GLOBAL Post 24.64 %    GLOBAL % Change 13.24 %            Scores of 19 and below usually indicate a poorer quality of life in these areas.  A difference of  2-3 points is a clinically meaningful difference.  A difference of 2-3 points in the total score of the Quality of Life Index has been associated with significant improvement in overall quality of life, self-image, physical symptoms, and general health in studies assessing change in quality of life.  PHQ-9: Review Flowsheet        04/03/2022 02/13/2022 01/20/2022  Depression screen PHQ 2/9  Decreased Interest 0 1 1  Down, Depressed, Hopeless 0 0 0  PHQ - 2 Score 0 1 1  Altered sleeping 0 1 1  Tired, decreased energy 0 1 2  Change in appetite $RemoveBef'1 1 1  'keUTpmeuDJ$ Feeling bad or failure about yourself  0 0 0  Trouble concentrating 1 0 1  Moving slowly or fidgety/restless 0 0 1  Suicidal thoughts 0 0 0  PHQ-9 Score $RemoveBef'2 4 7  'LhTrmCQmYU$ Difficult doing work/chores Not difficult at all Not difficult at all Not difficult at all         Interpretation of Total Score  Total Score Depression Severity:  1-4 = Minimal depression, 5-9 = Mild depression, 10-14 = Moderate depression, 15-19 = Moderately severe depression,  20-27 = Severe depression   Psychosocial Evaluation and Intervention:  Psychosocial Evaluation - 01/10/22 1312       Psychosocial Evaluation & Interventions   Interventions Encouraged to exercise with the program and follow exercise prescription    Comments Mr. 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 He states he is recovering smoothly and is ready to get his strength back. He reports he wasn't too active leading up to the surgery due to him slowing down. Now he can tell it was his heart issues inhibiting him from doing everything he wanted to do. His wife, son, and sister are very supportive. He and his doctors are trying to get his diabetes better controlled so he is looking forwards to working on that and increasing his strength.    Expected Outcomes Short; attend cardiac rehab for education and exercise. Long; develop and maintain positive self care habits.    Continue Psychosocial Services  Follow up required by staff             Psychosocial Re-Evaluation:  Psychosocial Re-Evaluation     [place] Name 02/13/22 1630 03/12/22 1621 03/26/22 1707         Psychosocial Re-Evaluation   Current issues with None Identified None Identified None Identified     Comments Reviewed patient health questionnaire (PHQ-9) with patient for follow up. Previously, patients score indicated signs/symptoms of depression.  Reviewed to see if patient is improving symptom wise while in program.  Score improved and patient states that it is because he has been sleeping better and as had more energy due to exercising. Patient reports no issues with their current mental states, sleep, stress, depression or anxiety. Will follow up with patient in a few weeks for any changes. Jesse Curtis is doing well overall mentally. He declines any problems at this time with depressiom, anxiety. He does not have any problems with sleep and his wife is great support. She  comes to listen with him to the education classes so they can  work together. He is getting close to graduation and feels stronger and better balance overall.     Expected Outcomes Short: Continue to attend [facility] regularly for regular exercise and social engagement. Long: Continue to improve symptoms and manage a positive mental state. Short: Continue to exercise regularly to support mental health and notify staff of any changes. Long: maintain mental health and well being through teaching of rehab or prescribed medications independently. Short: Graduate Long: Continue to maintain positive attitude and continue to exercise independently     Interventions Encouraged to attend Cardiac Rehabilitation for the exercise Encouraged to attend Cardiac Rehabilitation for the exercise Encouraged to attend Cardiac Rehabilitation for the exercise     Continue Psychosocial Services  Follow up required by staff Follow up required by staff Follow up required by staff              Psychosocial Discharge (Final Psychosocial Re-Evaluation):  Psychosocial Re-Evaluation - 03/26/22 1707       Psychosocial Re-Evaluation   Current issues with None Identified    Comments Jesse Curtis is doing well overall mentally. He declines any problems at this time with depressiom, anxiety. He does not have any problems with sleep and his wife is great support. She comes to listen with him to the education classes so they can work together. He is getting close to graduation and feels stronger and better balance overall.    Expected Outcomes Short: Graduate Long: Continue to maintain positive attitude and continue to exercise independently    Interventions Encouraged to attend Cardiac Rehabilitation for the exercise    Continue Psychosocial Services  Follow up required by staff             Vocational Rehabilitation: Provide vocational rehab assistance to qualifying candidates.   Vocational Rehab Evaluation & Intervention:  Vocational Rehab - 01/10/22 1314       Initial  Vocational Rehab Evaluation & Intervention   Assessment shows need for Vocational Rehabilitation No             Education: Education Goals: Education classes will be provided on a variety of topics geared toward better understanding of heart health and risk factor modification. Participant will state understanding/return demonstration of topics presented as noted by education test scores.  Learning Barriers/Preferences:  Learning Barriers/Preferences - 01/10/22 1314       Learning Barriers/Preferences   Learning Barriers None    Learning Preferences None             General Cardiac Education Topics:  AED/CPR: - Group verbal and written instruction with the use of models to demonstrate the basic use of the AED with the basic ABC's of resuscitation.   Anatomy and Cardiac Procedures: - Group verbal and visual presentation and models provide information about basic cardiac anatomy and function. Reviews the testing methods done to diagnose heart disease and the outcomes of the test results. Describes the treatment choices: Medical Management, Angioplasty, or Coronary Bypass Surgery for treating various heart conditions including Myocardial Infarction, Angina, Valve Disease, and Cardiac Arrhythmias.  Written material given at graduation. Flowsheet Row Cardiac Rehab from 03/26/2022 in [facility] Cardiac and Pulmonary Rehab  Date 03/12/22  Educator SB  Instruction Review Code 1- Verbalizes Understanding       Medication Safety: - Group verbal and visual instruction to review commonly prescribed medications for heart and lung disease. Reviews the medication, class of the drug, and side effects.  Includes the steps to properly store meds and maintain the prescription regimen.  Written material given at graduation. Flowsheet Row Cardiac Rehab from 03/26/2022 in [facility] Cardiac and Pulmonary Rehab  Date 01/29/22  Educator [facility]  Instruction Review Code 1- Verbalizes Understanding        Intimacy: - Group verbal instruction through game format to discuss how heart and lung disease can affect sexual intimacy. Written material given at graduation.. Flowsheet Row Cardiac Rehab from 03/26/2022 in [facility] Cardiac and Pulmonary Rehab  Date 03/05/22  Educator [place]  Instruction Review Code 1- Verbalizes Understanding       Know Your Numbers and Heart Failure: - Group verbal and visual instruction to discuss disease risk factors for cardiac and pulmonary disease and treatment options.  Reviews associated critical values for Overweight/Obesity, Hypertension, Cholesterol, and Diabetes.  Discusses basics of heart failure: signs/symptoms and treatments.  Introduces Heart Failure Zone chart for action plan for heart failure.  Written material given at graduation. Flowsheet Row Cardiac Rehab from 03/26/2022 in [facility] Cardiac and Pulmonary Rehab  Date 02/05/22  Educator [facility]  Instruction Review Code 1- Verbalizes Understanding       Infection Prevention: - Provides verbal and written material to individual with discussion of infection control including proper hand washing and proper equipment cleaning during exercise session. Flowsheet Row Cardiac Rehab from 03/26/2022 in [facility] Cardiac and Pulmonary Rehab  Date 01/20/22  Educator AS  Instruction Review Code 1- Verbalizes Understanding       Falls Prevention: - Provides verbal and written material to individual with discussion of falls prevention and safety. Flowsheet Row Cardiac Rehab from 03/26/2022 in [facility] Cardiac and Pulmonary Rehab  Date 01/20/22  Educator AS  Instruction Review Code 1- Verbalizes Understanding       Other: -Provides group and verbal instruction on various topics (see comments)   Knowledge Questionnaire Score:  Knowledge Questionnaire Score - 04/03/22 0726       Knowledge Questionnaire Score   Pre Score 22/26    Post Score 25/26             Core Components/Risk Factors/Patient Goals at  Admission:  Personal Goals and Risk Factors at Admission - 01/10/22 1309       Core Components/Risk Factors/Patient Goals on Admission   Diabetes Yes    Intervention Provide education about signs/symptoms and action to take for hypo/hyperglycemia.;Provide education about proper nutrition, including hydration, and aerobic/resistive exercise prescription along with prescribed medications to achieve blood glucose in normal ranges: Fasting glucose 65-99 mg/dL    Expected Outcomes Short Term: Participant verbalizes understanding of the signs/symptoms and immediate care of hyper/hypoglycemia, proper foot care and importance of medication, aerobic/resistive exercise and nutrition plan for blood glucose control.;Long Term: Attainment of HbA1C < 7%.    Hypertension Yes    Intervention Provide education on lifestyle modifcations including regular physical activity/exercise, weight management, moderate sodium restriction and increased consumption of fresh fruit, vegetables, and low fat dairy, alcohol moderation, and smoking cessation.;Monitor prescription use compliance.    Expected Outcomes Short Term: Continued assessment and intervention until BP is < 140/68mm HG in hypertensive participants. < 130/62mm HG in hypertensive participants with diabetes, heart failure or chronic kidney disease.;Long Term: Maintenance of blood pressure at goal levels.    Lipids Yes    Intervention Provide education and support for participant on nutrition & aerobic/resistive exercise along with prescribed medications to achieve LDL '70mg'$ , HDL >$Remo'40mg'nVwdt$ .    Expected Outcomes Short Term: Participant states understanding of desired cholesterol values  and is compliant with medications prescribed. Participant is following exercise prescription and nutrition guidelines.;Long Term: Cholesterol controlled with medications as prescribed, with individualized exercise RX and with personalized nutrition plan. Value goals: LDL < $Rem'70mg'Rirj$ , HDL > 40 mg.              Education:Diabetes - Individual verbal and written instruction to review signs/symptoms of diabetes, desired ranges of glucose level fasting, after meals and with exercise. Acknowledge that pre and post exercise glucose checks will be done for 3 sessions at entry of program. [place] from 01/10/2022 in [facility] Cardiac and Pulmonary Rehab  Date 01/10/22  Educator [facility]  Instruction Review Code 1- Verbalizes Understanding       Core Components/Risk Factors/Patient Goals Review:   Goals and Risk Factor Review     Row Name 02/13/22 1632 03/12/22 1627 03/26/22 1730         Core Components/Risk Factors/Patient Goals Review   Personal Goals Review Weight Management/Obesity;Hypertension;Diabetes Weight Management/Obesity Weight Management/Obesity;Diabetes;Hypertension     Review Jesse Curtis states that he checks his blood pressure and sugars at home. His fasting sugars have been around 110. He is doing well in the program and wants to lose a few pounds. Jesse Curtis has been doing great in the program and is getting stronger. He would still like to lose more weight. He wants to lose about ten pounds total. We spoke about nutrition and he has some diet controls he is gong to work on. Jesse Curtis continues to do well. Jesse Curtis states he checks his weight every morning and he is seeing it increase since he stopped Ozempic. He checks his sugars routinely and it is trending higher over day. He is going to talk to his doctor about his trends as he starts off at 34 and then ends at 200. He has an appointment with endo next month. He is going to graduate in the next couple of weeks.     Expected Outcomes Short: lose more weight. Long: maintain weight loss independently. Short: lose 5 pounds in the next few weeks. Long: reach a weight goal of 155 pounds. Short: Talk to doctor about sugars and graduate Long: Continue to manage lifestyle risk factors              Core Components/Risk  Factors/Patient Goals at Discharge (Final Review):   Goals and Risk Factor Review - 03/26/22 1730       Core Components/Risk Factors/Patient Goals Review   Personal Goals Review Weight Management/Obesity;Diabetes;Hypertension    Review Jesse Curtis continues to do well. Jesse Curtis states he checks his weight every morning and he is seeing it increase since he stopped Ozempic. He checks his sugars routinely and it is trending higher over day. He is going to talk to his doctor about his trends as he starts off at 56 and then ends at 200. He has an appointment with endo next month. He is going to graduate in the next couple of weeks.    Expected Outcomes Short: Talk to doctor about sugars and graduate Long: Continue to manage lifestyle risk factors             ITP Comments:  ITP Comments     Row Name 01/10/22 1315 01/20/22 1623 01/22/22 0849 01/27/22 1617 02/19/22 1351   ITP Comments Initial telephone orientation completed. Diagnosis can be [facility] in [facility] 2/14. EP orientation scheduled for Monday 3/20 at 3pm. Completed 6MWT and gym orientation. Initial ITP created and sent for review to Dr. Emily Filbert, Medical  Director. 30 Day review completed. Medical Director ITP review done, changes made as directed, and signed approval by Medical Director. New to program First full day of exercise!  Patient was oriented to gym and equipment including functions, settings, policies, and procedures.  Patient's individual exercise prescription and treatment plan were reviewed.  All starting workloads were established based on the results of the 6 minute walk test done at initial orientation visit.  The plan for exercise progression was also introduced and progression will be customized based on patient's performance and goals. 30 Day review completed. Medical Director ITP review done, changes made as directed, and signed approval by Medical Director.    [place] Name 03/19/22 0904 04/07/22 1605         ITP Comments 30 Day review  completed. Medical Director ITP review done, changes made as directed, and signed approval by Medical Director. Tiberius graduated today from  rehab with 36 sessions completed.  Details of the patient's exercise prescription and what He needs to do in order to continue the prescription and progress were discussed with patient.  Patient was given a copy of prescription and goals.  Patient verbalized understanding.  Eric plans to continue to exercise by walking at home and joining a gym.               Comments: discharge ITP

## 2022-04-22 DIAGNOSIS — E1165 Type 2 diabetes mellitus with hyperglycemia: Secondary | ICD-10-CM | POA: Diagnosis not present

## 2022-04-22 DIAGNOSIS — E7800 Pure hypercholesterolemia, unspecified: Secondary | ICD-10-CM | POA: Diagnosis not present

## 2022-04-22 DIAGNOSIS — I10 Essential (primary) hypertension: Secondary | ICD-10-CM | POA: Diagnosis not present

## 2022-04-29 DIAGNOSIS — I2510 Atherosclerotic heart disease of native coronary artery without angina pectoris: Secondary | ICD-10-CM | POA: Diagnosis not present

## 2022-04-29 DIAGNOSIS — I10 Essential (primary) hypertension: Secondary | ICD-10-CM | POA: Diagnosis not present

## 2022-04-29 DIAGNOSIS — E7800 Pure hypercholesterolemia, unspecified: Secondary | ICD-10-CM | POA: Diagnosis not present

## 2022-04-29 DIAGNOSIS — E1165 Type 2 diabetes mellitus with hyperglycemia: Secondary | ICD-10-CM | POA: Diagnosis not present

## 2022-05-12 ENCOUNTER — Emergency Department
Admission: EM | Admit: 2022-05-12 | Discharge: 2022-05-12 | Discharge status: Against Medical Advice | Payer: Medicare HMO | Attending: Student | Admitting: Student

## 2022-05-12 ENCOUNTER — Other Ambulatory Visit: Payer: Self-pay

## 2022-05-12 DIAGNOSIS — E1165 Type 2 diabetes mellitus with hyperglycemia: Secondary | ICD-10-CM | POA: Diagnosis not present

## 2022-05-12 DIAGNOSIS — I2510 Atherosclerotic heart disease of native coronary artery without angina pectoris: Secondary | ICD-10-CM | POA: Diagnosis not present

## 2022-05-12 DIAGNOSIS — Z951 Presence of aortocoronary bypass graft: Secondary | ICD-10-CM | POA: Diagnosis not present

## 2022-05-12 DIAGNOSIS — R1110 Vomiting, unspecified: Secondary | ICD-10-CM | POA: Insufficient documentation

## 2022-05-12 DIAGNOSIS — Z5321 Procedure and treatment not carried out due to patient leaving prior to being seen by health care provider: Secondary | ICD-10-CM | POA: Insufficient documentation

## 2022-05-12 DIAGNOSIS — Z794 Long term (current) use of insulin: Secondary | ICD-10-CM | POA: Insufficient documentation

## 2022-05-12 DIAGNOSIS — E213 Hyperparathyroidism, unspecified: Secondary | ICD-10-CM | POA: Insufficient documentation

## 2022-05-12 DIAGNOSIS — R42 Dizziness and giddiness: Secondary | ICD-10-CM | POA: Insufficient documentation

## 2022-05-12 DIAGNOSIS — R5383 Other fatigue: Secondary | ICD-10-CM | POA: Diagnosis not present

## 2022-05-12 LAB — URINALYSIS, ROUTINE W REFLEX MICROSCOPIC
Bacteria, UA: NONE SEEN
Bilirubin Urine: NEGATIVE
Glucose, UA: 50 mg/dL — AB
Hgb urine dipstick: NEGATIVE
Ketones, ur: 20 mg/dL — AB
Leukocytes,Ua: NEGATIVE
Nitrite: NEGATIVE
Protein, ur: 30 mg/dL — AB
Specific Gravity, Urine: 1.023 (ref 1.005–1.030)
pH: 5 (ref 5.0–8.0)

## 2022-05-12 LAB — BASIC METABOLIC PANEL
Anion gap: 15 (ref 5–15)
BUN: 16 mg/dL (ref 8–23)
CO2: 25 mmol/L (ref 22–32)
Calcium: 10.1 mg/dL (ref 8.9–10.3)
Chloride: 100 mmol/L (ref 98–111)
Creatinine, Ser: 0.89 mg/dL (ref 0.61–1.24)
GFR, Estimated: >60 mL/min (ref >60)
Glucose, Bld: 190 mg/dL — ABNORMAL HIGH (ref 70–99)
Potassium: 4.3 mmol/L (ref 3.5–5.1)
Sodium: 140 mmol/L (ref 135–145)

## 2022-05-12 LAB — CBC WITH DIFFERENTIAL/PLATELET
Abs Immature Granulocytes: 0.04 10*3/uL (ref 0.00–0.07)
Basophils Absolute: 0 10*3/uL (ref 0.0–0.1)
Basophils Relative: 0 %
Eosinophils Absolute: 0 10*3/uL (ref 0.0–0.5)
Eosinophils Relative: 0 %
HCT: 45.5 % (ref 39.0–52.0)
Hemoglobin: 14.7 g/dL (ref 13.0–17.0)
Immature Granulocytes: 0 %
Lymphocytes Relative: 6 %
Lymphs Abs: 0.8 10*3/uL (ref 0.7–4.0)
MCH: 29.9 pg (ref 26.0–34.0)
MCHC: 32.3 g/dL (ref 30.0–36.0)
MCV: 92.5 fL (ref 80.0–100.0)
Monocytes Absolute: 0.5 10*3/uL (ref 0.1–1.0)
Monocytes Relative: 4 %
Neutro Abs: 13.3 10*3/uL — ABNORMAL HIGH (ref 1.7–7.7)
Neutrophils Relative %: 90 %
Platelets: 241 10*3/uL (ref 150–400)
RBC: 4.92 MIL/uL (ref 4.22–5.81)
RDW: 14.6 % (ref 11.5–15.5)
WBC: 14.8 10*3/uL — ABNORMAL HIGH (ref 4.0–10.5)
nRBC: 0 % (ref 0.0–0.2)

## 2022-05-12 LAB — LIPASE, BLOOD: Lipase: 27 U/L (ref 11–51)

## 2022-05-12 LAB — HEPATIC FUNCTION PANEL
ALT: 31 U/L (ref 0–44)
AST: 32 U/L (ref 15–41)
Albumin: 4.3 g/dL (ref 3.5–5.0)
Alkaline Phosphatase: 87 U/L (ref 38–126)
Bilirubin, Direct: <0.1 mg/dL (ref 0.0–0.2)
Total Bilirubin: 1.1 mg/dL (ref 0.3–1.2)
Total Protein: 7.5 g/dL (ref 6.5–8.1)

## 2022-05-12 LAB — TROPONIN I (HIGH SENSITIVITY): Troponin I (High Sensitivity): 9 ng/L (ref <18)

## 2022-05-12 NOTE — ED Triage Notes (Signed)
Pt comes with c/o dizziness, vomiting that started last night. Pt states no pain at this time.   Pt states no dizziness at this time. Pt states he feels uneasy when walking.

## 2022-05-12 NOTE — ED Provider Triage Note (Signed)
Emergency Medicine Provider Triage Evaluation Note  Jesse Curtis , a 76 y.o. male  was evaluated in triage.  Pt complains of dizziness since last night and vomiting since this morning. No diarrhea. No abdominal pain, no chest pain, no SOB. Has not eaten or had anything to drink all day. Does not feel dizzy right now. Reports sugars have been in the 200s. No headache  Patient Active Problem List   Diagnosis Date Noted   S/P coronary artery bypass graft x 4 81/27/5170   Diastolic dysfunction 01/74/9449   Mitral regurgitation 12/03/2021   Coronary artery disease involving native coronary artery of native heart 12/02/2021   Angina pectoris ([place]) 11/20/2021   Benign essential HTN 10/17/2021   OSA on CPAP 09/09/2019   Vitamin B 12 deficiency 08/22/2018   Traumatic complete tear of right rotator cuff 12/18/2017   BPH (benign prostatic hyperplasia) 08/18/2013   Hyperlipidemia 08/18/2013   Type 2 diabetes mellitus with hyperglycemia, with long-term current use of insulin ([place]) 08/18/2013   Hypercalcemia 06/27/2013   Hyperparathyroidism ([place]) 06/27/2013   .  Review of Systems  Positive: Lightheaded, vomiting Negative: Diarrhea, abd pain, chest pain, sob  Physical Exam  There were no vitals taken for this visit. Gen:   Awake, no distress   Resp:  Normal effort  MSK:   Moves extremities without difficulty  Other:    Medical Decision Making  Medically screening exam initiated at 6:54 PM.  Appropriate orders placed.  Jesse Curtis was informed that the remainder of the evaluation will be completed by another provider, this initial triage assessment does not replace that evaluation, and the importance of remaining in the ED until their evaluation is complete.     Jesse Old, PA-C 05/12/22 1859

## 2022-05-15 DIAGNOSIS — I2510 Atherosclerotic heart disease of native coronary artery without angina pectoris: Secondary | ICD-10-CM | POA: Diagnosis not present

## 2022-05-15 DIAGNOSIS — G4733 Obstructive sleep apnea (adult) (pediatric): Secondary | ICD-10-CM | POA: Diagnosis not present

## 2022-05-15 DIAGNOSIS — R42 Dizziness and giddiness: Secondary | ICD-10-CM | POA: Diagnosis not present

## 2022-05-15 DIAGNOSIS — Z794 Long term (current) use of insulin: Secondary | ICD-10-CM | POA: Diagnosis not present

## 2022-05-15 DIAGNOSIS — E892 Postprocedural hypoparathyroidism: Secondary | ICD-10-CM | POA: Diagnosis not present

## 2022-05-15 DIAGNOSIS — C61 Malignant neoplasm of prostate: Secondary | ICD-10-CM | POA: Diagnosis not present

## 2022-05-15 DIAGNOSIS — E119 Type 2 diabetes mellitus without complications: Secondary | ICD-10-CM | POA: Diagnosis not present

## 2022-05-15 DIAGNOSIS — R110 Nausea: Secondary | ICD-10-CM | POA: Diagnosis not present

## 2022-05-15 DIAGNOSIS — Z9989 Dependence on other enabling machines and devices: Secondary | ICD-10-CM | POA: Diagnosis not present

## 2022-05-26 DIAGNOSIS — E538 Deficiency of other specified B group vitamins: Secondary | ICD-10-CM | POA: Diagnosis not present

## 2022-05-26 DIAGNOSIS — I10 Essential (primary) hypertension: Secondary | ICD-10-CM | POA: Diagnosis not present

## 2022-05-26 DIAGNOSIS — Z9989 Dependence on other enabling machines and devices: Secondary | ICD-10-CM | POA: Diagnosis not present

## 2022-05-26 DIAGNOSIS — E1165 Type 2 diabetes mellitus with hyperglycemia: Secondary | ICD-10-CM | POA: Diagnosis not present

## 2022-05-26 DIAGNOSIS — Z794 Long term (current) use of insulin: Secondary | ICD-10-CM | POA: Diagnosis not present

## 2022-05-28 DIAGNOSIS — M216X1 Other acquired deformities of right foot: Secondary | ICD-10-CM | POA: Diagnosis not present

## 2022-05-28 DIAGNOSIS — M722 Plantar fascial fibromatosis: Secondary | ICD-10-CM | POA: Diagnosis not present

## 2022-05-28 DIAGNOSIS — E1142 Type 2 diabetes mellitus with diabetic polyneuropathy: Secondary | ICD-10-CM | POA: Diagnosis not present

## 2022-05-28 DIAGNOSIS — M79672 Pain in left foot: Secondary | ICD-10-CM | POA: Diagnosis not present

## 2022-05-28 DIAGNOSIS — M216X2 Other acquired deformities of left foot: Secondary | ICD-10-CM | POA: Diagnosis not present

## 2022-05-28 DIAGNOSIS — M7732 Calcaneal spur, left foot: Secondary | ICD-10-CM | POA: Diagnosis not present

## 2022-06-02 DIAGNOSIS — E7800 Pure hypercholesterolemia, unspecified: Secondary | ICD-10-CM | POA: Diagnosis not present

## 2022-06-02 DIAGNOSIS — Z9989 Dependence on other enabling machines and devices: Secondary | ICD-10-CM | POA: Diagnosis not present

## 2022-06-02 DIAGNOSIS — I25810 Atherosclerosis of coronary artery bypass graft(s) without angina pectoris: Secondary | ICD-10-CM | POA: Diagnosis not present

## 2022-06-02 DIAGNOSIS — E119 Type 2 diabetes mellitus without complications: Secondary | ICD-10-CM | POA: Diagnosis not present

## 2022-06-02 DIAGNOSIS — G4733 Obstructive sleep apnea (adult) (pediatric): Secondary | ICD-10-CM | POA: Diagnosis not present

## 2022-06-02 DIAGNOSIS — E892 Postprocedural hypoparathyroidism: Secondary | ICD-10-CM | POA: Diagnosis not present

## 2022-06-02 DIAGNOSIS — C61 Malignant neoplasm of prostate: Secondary | ICD-10-CM | POA: Diagnosis not present

## 2022-06-02 DIAGNOSIS — Z794 Long term (current) use of insulin: Secondary | ICD-10-CM | POA: Diagnosis not present

## 2022-07-23 DIAGNOSIS — I10 Essential (primary) hypertension: Secondary | ICD-10-CM | POA: Diagnosis not present

## 2022-07-23 DIAGNOSIS — E1165 Type 2 diabetes mellitus with hyperglycemia: Secondary | ICD-10-CM | POA: Diagnosis not present

## 2022-07-23 DIAGNOSIS — E7800 Pure hypercholesterolemia, unspecified: Secondary | ICD-10-CM | POA: Diagnosis not present

## 2022-07-30 DIAGNOSIS — I2510 Atherosclerotic heart disease of native coronary artery without angina pectoris: Secondary | ICD-10-CM | POA: Diagnosis not present

## 2022-07-30 DIAGNOSIS — E1165 Type 2 diabetes mellitus with hyperglycemia: Secondary | ICD-10-CM | POA: Diagnosis not present

## 2022-07-30 DIAGNOSIS — I10 Essential (primary) hypertension: Secondary | ICD-10-CM | POA: Diagnosis not present

## 2022-07-30 DIAGNOSIS — E7800 Pure hypercholesterolemia, unspecified: Secondary | ICD-10-CM | POA: Diagnosis not present

## 2022-09-08 ENCOUNTER — Encounter: Payer: Self-pay | Admitting: *Deleted

## 2022-09-08 ENCOUNTER — Telehealth: Payer: Self-pay | Admitting: *Deleted

## 2022-09-08 NOTE — Patient Outreach (Signed)
  Care Coordination   Initial Visit Note   09/08/2022 Name: Jesse Curtis MRN: [id number] DOB: 1946-08-24  Jesse Curtis is a 76 y.o. year old male who sees Tracie Harrier, MD for primary care. I spoke with  Jesse Curtis by phone today.  What matters to the patients health and wellness today?  Management of DM.  State he understands how to manage, and has been effective.  Currently does not feel follow up is needed but will contact this RNCM with any questions/concerns.    Goals Addressed             This Visit's Progress    COMPLETED: Effective management of DM       Care Coordination Interventions: Provided education to patient about basic DM disease process Reviewed medications with patient and discussed importance of medication adherence Counseled on importance of regular laboratory monitoring as prescribed Discussed plans with patient for ongoing care management follow up and provided patient with direct contact information for care management team Reviewed scheduled/upcoming provider appointments including: 6 month follow up in January Advised patient, providing education and rationale, to check cbg 3-4 times a day and record, calling provider for findings outside established parameters Assessed social determinant of health barriers Discussed use of Rybelsus for DM management. State blood sugars have been well controlled.  Will have A1C rechecked during next PCP visit.  Most recent one was less than 8. Discussed cost of medications, denies need for medication assistance.          SDOH assessments and interventions completed:  Yes  SDOH Interventions Today    Flowsheet Row Most Recent Value  SDOH Interventions   Food Insecurity Interventions Intervention Not Indicated  Housing Interventions Intervention Not Indicated  Transportation Interventions Intervention Not Indicated  Utilities Interventions Intervention Not Indicated        Care Coordination  Interventions Activated:  Yes  Care Coordination Interventions:  Yes, provided   Follow up plan: No further intervention required.   Encounter Outcome:  Pt. Visit Completed   Valente David, RN, MSN, [place] Care Management Care Management Coordinator [phone]

## 2022-09-08 NOTE — Patient Instructions (Signed)
Visit Information  Thank you for taking time to visit with me today. Please don't hesitate to contact me if I can be of assistance to you.  Following are the goals we discussed today:  Continue monitoring blood sugars several times a day. Notify care coordinator if price of medications are too expensive Continue regular DM maintenance (eye exams and foot exams)  Please call the Suicide and Crisis Lifeline: 988 call the [place] National Suicide Prevention Lifeline: (408)724-8444 or TTY: (516) 031-7472 TTY 6013753005) to talk to a trained counselor call 1-800-273-TALK (toll free, 24 hour hotline) call 911 if you are experiencing a Mental Health or [place] or need someone to talk to.  The patient verbalized understanding of instructions, educational materials, and care plan provided today and agreed to receive a mailed copy of patient instructions, educational materials, and care plan.   The patient has been provided with contact information for the care management team and has been advised to call with any health related questions or concerns.   Valente David, RN, MSN, [place] [facility] Care Management Coordinator [phone]

## 2022-11-20 DIAGNOSIS — Z794 Long term (current) use of insulin: Secondary | ICD-10-CM | POA: Diagnosis not present

## 2022-11-20 DIAGNOSIS — I25810 Atherosclerosis of coronary artery bypass graft(s) without angina pectoris: Secondary | ICD-10-CM | POA: Diagnosis not present

## 2022-11-20 DIAGNOSIS — N400 Enlarged prostate without lower urinary tract symptoms: Secondary | ICD-10-CM | POA: Diagnosis not present

## 2022-11-20 DIAGNOSIS — E7800 Pure hypercholesterolemia, unspecified: Secondary | ICD-10-CM | POA: Diagnosis not present

## 2022-11-20 DIAGNOSIS — G4733 Obstructive sleep apnea (adult) (pediatric): Secondary | ICD-10-CM | POA: Diagnosis not present

## 2022-11-20 DIAGNOSIS — E1165 Type 2 diabetes mellitus with hyperglycemia: Secondary | ICD-10-CM | POA: Diagnosis not present

## 2022-12-08 DIAGNOSIS — R001 Bradycardia, unspecified: Secondary | ICD-10-CM | POA: Diagnosis not present

## 2022-12-08 DIAGNOSIS — E785 Hyperlipidemia, unspecified: Secondary | ICD-10-CM | POA: Diagnosis not present

## 2022-12-08 DIAGNOSIS — I10 Essential (primary) hypertension: Secondary | ICD-10-CM | POA: Diagnosis not present

## 2022-12-08 DIAGNOSIS — Z7982 Long term (current) use of aspirin: Secondary | ICD-10-CM | POA: Diagnosis not present

## 2022-12-08 DIAGNOSIS — E119 Type 2 diabetes mellitus without complications: Secondary | ICD-10-CM | POA: Diagnosis not present

## 2022-12-08 DIAGNOSIS — Z951 Presence of aortocoronary bypass graft: Secondary | ICD-10-CM | POA: Diagnosis not present

## 2022-12-08 DIAGNOSIS — G4733 Obstructive sleep apnea (adult) (pediatric): Secondary | ICD-10-CM | POA: Diagnosis not present

## 2022-12-08 DIAGNOSIS — I493 Ventricular premature depolarization: Secondary | ICD-10-CM | POA: Diagnosis not present

## 2022-12-08 DIAGNOSIS — I2510 Atherosclerotic heart disease of native coronary artery without angina pectoris: Secondary | ICD-10-CM | POA: Diagnosis not present

## 2022-12-09 DIAGNOSIS — E1165 Type 2 diabetes mellitus with hyperglycemia: Secondary | ICD-10-CM | POA: Diagnosis not present

## 2022-12-09 DIAGNOSIS — Z794 Long term (current) use of insulin: Secondary | ICD-10-CM | POA: Diagnosis not present

## 2022-12-09 DIAGNOSIS — I25810 Atherosclerosis of coronary artery bypass graft(s) without angina pectoris: Secondary | ICD-10-CM | POA: Diagnosis not present

## 2022-12-09 DIAGNOSIS — E7800 Pure hypercholesterolemia, unspecified: Secondary | ICD-10-CM | POA: Diagnosis not present

## 2023-01-08 DIAGNOSIS — Z794 Long term (current) use of insulin: Secondary | ICD-10-CM | POA: Diagnosis not present

## 2023-01-08 DIAGNOSIS — G4733 Obstructive sleep apnea (adult) (pediatric): Secondary | ICD-10-CM | POA: Diagnosis not present

## 2023-01-08 DIAGNOSIS — K08409 Partial loss of teeth, unspecified cause, unspecified class: Secondary | ICD-10-CM | POA: Diagnosis not present

## 2023-01-08 DIAGNOSIS — Z951 Presence of aortocoronary bypass graft: Secondary | ICD-10-CM | POA: Diagnosis not present

## 2023-01-08 DIAGNOSIS — E213 Hyperparathyroidism, unspecified: Secondary | ICD-10-CM | POA: Diagnosis not present

## 2023-01-08 DIAGNOSIS — I10 Essential (primary) hypertension: Secondary | ICD-10-CM | POA: Diagnosis not present

## 2023-01-08 DIAGNOSIS — E119 Type 2 diabetes mellitus without complications: Secondary | ICD-10-CM | POA: Diagnosis not present

## 2023-01-08 DIAGNOSIS — K0889 Other specified disorders of teeth and supporting structures: Secondary | ICD-10-CM | POA: Diagnosis not present

## 2023-01-21 DIAGNOSIS — I2510 Atherosclerotic heart disease of native coronary artery without angina pectoris: Secondary | ICD-10-CM | POA: Diagnosis not present

## 2023-01-21 DIAGNOSIS — I10 Essential (primary) hypertension: Secondary | ICD-10-CM | POA: Diagnosis not present

## 2023-01-21 DIAGNOSIS — E7800 Pure hypercholesterolemia, unspecified: Secondary | ICD-10-CM | POA: Diagnosis not present

## 2023-01-21 DIAGNOSIS — E1165 Type 2 diabetes mellitus with hyperglycemia: Secondary | ICD-10-CM | POA: Diagnosis not present

## 2023-01-28 DIAGNOSIS — E7800 Pure hypercholesterolemia, unspecified: Secondary | ICD-10-CM | POA: Diagnosis not present

## 2023-01-28 DIAGNOSIS — I10 Essential (primary) hypertension: Secondary | ICD-10-CM | POA: Diagnosis not present

## 2023-01-28 DIAGNOSIS — E1165 Type 2 diabetes mellitus with hyperglycemia: Secondary | ICD-10-CM | POA: Diagnosis not present

## 2023-01-28 DIAGNOSIS — I2510 Atherosclerotic heart disease of native coronary artery without angina pectoris: Secondary | ICD-10-CM | POA: Diagnosis not present

## 2023-02-02 DIAGNOSIS — G4733 Obstructive sleep apnea (adult) (pediatric): Secondary | ICD-10-CM | POA: Diagnosis not present

## 2023-02-02 DIAGNOSIS — Z0000 Encounter for general adult medical examination without abnormal findings: Secondary | ICD-10-CM | POA: Diagnosis not present

## 2023-02-02 DIAGNOSIS — E119 Type 2 diabetes mellitus without complications: Secondary | ICD-10-CM | POA: Diagnosis not present

## 2023-02-02 DIAGNOSIS — I10 Essential (primary) hypertension: Secondary | ICD-10-CM | POA: Diagnosis not present

## 2023-02-02 DIAGNOSIS — Z1331 Encounter for screening for depression: Secondary | ICD-10-CM | POA: Diagnosis not present

## 2023-02-02 DIAGNOSIS — Z794 Long term (current) use of insulin: Secondary | ICD-10-CM | POA: Diagnosis not present

## 2023-02-02 DIAGNOSIS — Z8546 Personal history of malignant neoplasm of prostate: Secondary | ICD-10-CM | POA: Diagnosis not present

## 2023-02-02 DIAGNOSIS — Z951 Presence of aortocoronary bypass graft: Secondary | ICD-10-CM | POA: Diagnosis not present

## 2023-02-25 DIAGNOSIS — E119 Type 2 diabetes mellitus without complications: Secondary | ICD-10-CM | POA: Diagnosis not present

## 2023-02-25 DIAGNOSIS — H524 Presbyopia: Secondary | ICD-10-CM | POA: Diagnosis not present

## 2023-02-25 DIAGNOSIS — H43813 Vitreous degeneration, bilateral: Secondary | ICD-10-CM | POA: Diagnosis not present

## 2023-02-25 DIAGNOSIS — Z0100 Encounter for examination of eyes and vision without abnormal findings: Secondary | ICD-10-CM | POA: Diagnosis not present

## 2023-03-06 ENCOUNTER — Ambulatory Visit: Payer: Medicare HMO | Admitting: Urology

## 2023-03-26 ENCOUNTER — Ambulatory Visit: Payer: Medicare HMO | Admitting: Urology

## 2023-04-16 ENCOUNTER — Ambulatory Visit: Payer: Medicare HMO | Admitting: Urology

## 2023-04-16 ENCOUNTER — Encounter: Payer: Self-pay | Admitting: Urology

## 2023-04-16 VITALS — BP 121/73 | HR 88 | Ht 65.75 in | Wt 165.0 lb

## 2023-04-16 DIAGNOSIS — C61 Malignant neoplasm of prostate: Secondary | ICD-10-CM

## 2023-04-16 DIAGNOSIS — N401 Enlarged prostate with lower urinary tract symptoms: Secondary | ICD-10-CM | POA: Diagnosis not present

## 2023-04-16 DIAGNOSIS — Z125 Encounter for screening for malignant neoplasm of prostate: Secondary | ICD-10-CM

## 2023-04-16 DIAGNOSIS — N400 Enlarged prostate without lower urinary tract symptoms: Secondary | ICD-10-CM

## 2023-04-16 LAB — URINALYSIS, COMPLETE
Bilirubin, UA: NEGATIVE
Glucose, UA: NEGATIVE
Ketones, UA: NEGATIVE
Leukocytes,UA: NEGATIVE
Nitrite, UA: NEGATIVE
Protein,UA: NEGATIVE
RBC, UA: NEGATIVE
Specific Gravity, UA: 1.015 (ref 1.005–1.030)
Urobilinogen, Ur: 0.2 mg/dL (ref 0.2–1.0)
pH, UA: 5 (ref 5.0–7.5)

## 2023-04-16 LAB — MICROSCOPIC EXAMINATION: Bacteria, UA: NONE SEEN

## 2023-04-16 NOTE — Progress Notes (Signed)
Jesse Curtis,acting as a scribe for Riki Altes, MD.,have documented all relevant documentation on the behalf of Riki Altes, MD,as directed by  Riki Altes, MD while in the presence of Riki Altes, MD.  04/16/2023 3:13 PM   Jesse Curtis 09-11-1946 [id number]  Referring provider: Barbette Reichmann, MD [address] [facility] [place],  [place] [address]  Chief Complaint  Patient presents with   New Patient (Initial Visit)    HPI: Jesse Curtis is a 77 y.o. male who presents to transfer urologic care.   Previously seen by Dr. 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 Done for a PSA of 12.4, and elected active surveillance.  Prostate volume was 100 cc's and he was having lower urinary tract symptoms at the time of his biopsy and started on finasteride 5 mg daily. He last saw Dr. Evelene Croon in 02/2022. His IPSS was 3/35 and PSA was stable at 1.6.  Since his last visit with Dr. Evelene Croon, he has had no voiding complaints Denies dysuria, gross hematuria Remains on finasteride  PMH: Past Medical History:  Diagnosis Date   Cancer (HCC)    PROSTATE   Diabetes mellitus without complication (HCC)    Fatigue    POSSIBLE SEIZURE  NO EVAL  NO FUTHER PROBLEM   GERD (gastroesophageal reflux disease)    ULCERS   Hypertension     Surgical History: Past Surgical History:  Procedure Laterality Date   CATARACT EXTRACTION W/PHACO Left 07/10/2015   Procedure: CATARACT EXTRACTION PHACO AND INTRAOCULAR LENS PLACEMENT (IOC);  Surgeon: Galen Manila, MD;  Location: [facility] ORS;  Service: Ophthalmology;  Laterality: Left;  [place]    02:00 AP% 67.6 CDE35.48 [id number] H   CATARACT EXTRACTION W/PHACO Right 07/31/2015   Procedure: CATARACT EXTRACTION PHACO AND INTRAOCULAR LENS PLACEMENT (IOC);  Surgeon: Galen Manila, MD;  Location: [facility] ORS;  Service: Ophthalmology;  Laterality: Right;  us02:17.1 ap 26.9 cde 36.87  fluid lot  # [id number]   COLONOSCOPY     COLONOSCOPY WITH PROPOFOL N/A 08/14/2017   Procedure: COLONOSCOPY WITH PROPOFOL;  Surgeon: Scot Jun, MD;  Location[facility] ENDOSCOPY;  Service: Endoscopy;  Laterality: N/A;   LEFT HEART CATH AND CORONARY ANGIOGRAPHY N/A 11/27/2021   Procedure: LEFT HEART CATH AND CORONARY ANGIOGRAPHY;  Surgeon: Lamar Blinks, MD;  Location: [facility] INVASIVE CV LAB;  Service: Cardiovascular;  Laterality: N/A;   PARATHYROIDECTOMY     SHOULDER ARTHROSCOPY WITH OPEN ROTATOR CUFF REPAIR Right 02/23/2018   Procedure: SHOULDER ARTHROSCOPY WITH OPEN ROTATOR CUFF REPAIR;  Surgeon: Christena Flake, MD;  Location: [facility] ORS;  Service: Orthopedics;  Laterality: Right;   VASECTOMY      Home Medications:  Allergies as of 04/16/2023   No Known Allergies      Medication List        Accurate as of April 16, 2023  3:13 PM. If you have any questions, ask your nurse or doctor.          Accu-Chek Guide test strip Generic drug: glucose blood daily.   Accu-Chek Softclix Lancet Dev Kit Use 1 each once daily Use as instructed.   Accu-Chek Softclix Lancets lancets TEST BLOOD SUGAR EVERY DAY   acetaminophen 650 MG CR tablet Commonly known as: TYLENOL Take 1,300 mg by mouth every 8 (eight) hours as needed for pain.   atorvastatin 40 MG tablet Commonly known as: LIPITOR Take 40 mg by mouth daily.  cyanocobalamin 1000 MCG tablet Commonly known as: VITAMIN B12 Take 1,000 mcg by mouth daily.   cyanocobalamin 1000 MCG tablet Take 1 tablet by mouth daily.   Droplet Pen Needles 31G X 8 MM Misc Generic drug: Insulin Pen Needle USE TWO TIMES DAILY   EYE VITAMINS & MINERALS PO Take 1 tablet by mouth daily.   ferrous sulfate 325 (65 FE) MG tablet Take 325 mg by mouth daily with breakfast.   finasteride 5 MG tablet Commonly known as: PROSCAR Take 5 mg by mouth daily.   Fish Oil 1000 MG Caps Take 1,000 mg by mouth daily.   glimepiride 4 MG tablet Commonly known as:  AMARYL Take 4 mg by mouth daily with breakfast.   lisinopril 5 MG tablet Commonly known as: ZESTRIL Take 5 mg by mouth daily.   metFORMIN 500 MG tablet Commonly known as: GLUCOPHAGE Take 1,000 mg by mouth 2 (two) times daily.   multivitamin with minerals Tabs tablet Take 1 tablet by mouth daily.   NovoLIN 70/30 Kwikpen (70-30) 100 UNIT/ML KwikPen Generic drug: insulin isophane & regular human KwikPen INJECT 28 UNITS SUBCUTANEOUSLY IN THE MORNING AND 20 UNITS AT NIGHT   omeprazole 20 MG tablet Commonly known as: PRILOSEC OTC Take 20 mg by mouth every other day. In the morning.   pioglitazone 30 MG tablet Commonly known as: ACTOS Take 30 mg by mouth daily.   Rybelsus 7 MG Tabs Generic drug: Semaglutide Take 1 tablet by mouth daily.   VITAMIN C PO Take 1,500 mg by mouth daily.   Vitamin D3 125 MCG (5000 UT) Caps Take 5,000 Units by mouth daily.   zinc gluconate 50 MG tablet Take 50 mg by mouth daily.        Social History:  reports that he has never smoked. He has never used smokeless tobacco. He reports that he does not drink alcohol and does not use drugs.   Physical Exam: BP 121/73   Pulse 88   Ht 5' 5.75" (1.67 m)   Wt 165 lb (74.8 kg)   BMI 26.83 kg/m   Constitutional:  Alert and oriented, No acute distress. HEENT: [place] AT Respiratory: Normal respiratory effort, no increased work of breathing. GU: Prostate 40 grams, smooth without nodules Psychiatric: Normal mood and affect.  Assessment & Plan:    1. T1c low risk prostate cancer Diagnosed in 2010 Stable PSA/benign DRE One year follow up with PSA  I have reviewed the above documentation for accuracy and completeness, and I agree with the above.   Riki Altes, MD  [facility] [address] [place], [place] [address] [phone]

## 2023-04-17 ENCOUNTER — Encounter: Payer: Self-pay | Admitting: *Deleted

## 2023-04-17 ENCOUNTER — Telehealth: Payer: Self-pay | Admitting: *Deleted

## 2023-04-17 LAB — PSA: Prostate Specific Ag, Serum: 1.5 ng/mL (ref 0.0–4.0)

## 2023-04-17 NOTE — Telephone Encounter (Signed)
Sent mychart message

## 2023-04-17 NOTE — Telephone Encounter (Signed)
-----   Message from Riki Altes, MD sent at 04/17/2023  7:08 AM EDT ----- PSA is stable at 1.5- continue annual follow-up

## 2023-04-29 DIAGNOSIS — Z794 Long term (current) use of insulin: Secondary | ICD-10-CM | POA: Diagnosis not present

## 2023-04-29 DIAGNOSIS — E1159 Type 2 diabetes mellitus with other circulatory complications: Secondary | ICD-10-CM | POA: Diagnosis not present

## 2023-04-29 DIAGNOSIS — I152 Hypertension secondary to endocrine disorders: Secondary | ICD-10-CM | POA: Diagnosis not present

## 2023-04-29 DIAGNOSIS — E1165 Type 2 diabetes mellitus with hyperglycemia: Secondary | ICD-10-CM | POA: Diagnosis not present

## 2023-04-29 DIAGNOSIS — R5383 Other fatigue: Secondary | ICD-10-CM | POA: Diagnosis not present

## 2023-05-04 DIAGNOSIS — E1165 Type 2 diabetes mellitus with hyperglycemia: Secondary | ICD-10-CM | POA: Diagnosis not present

## 2023-05-04 DIAGNOSIS — Z794 Long term (current) use of insulin: Secondary | ICD-10-CM | POA: Diagnosis not present

## 2023-05-04 DIAGNOSIS — E7800 Pure hypercholesterolemia, unspecified: Secondary | ICD-10-CM | POA: Diagnosis not present

## 2023-05-04 DIAGNOSIS — I25810 Atherosclerosis of coronary artery bypass graft(s) without angina pectoris: Secondary | ICD-10-CM | POA: Diagnosis not present

## 2023-05-05 DIAGNOSIS — I493 Ventricular premature depolarization: Secondary | ICD-10-CM | POA: Diagnosis not present

## 2023-05-05 DIAGNOSIS — Z951 Presence of aortocoronary bypass graft: Secondary | ICD-10-CM | POA: Diagnosis not present

## 2023-05-05 DIAGNOSIS — E785 Hyperlipidemia, unspecified: Secondary | ICD-10-CM | POA: Diagnosis not present

## 2023-05-05 DIAGNOSIS — I2510 Atherosclerotic heart disease of native coronary artery without angina pectoris: Secondary | ICD-10-CM | POA: Diagnosis not present

## 2023-05-05 DIAGNOSIS — R29898 Other symptoms and signs involving the musculoskeletal system: Secondary | ICD-10-CM | POA: Diagnosis not present

## 2023-05-05 DIAGNOSIS — G4733 Obstructive sleep apnea (adult) (pediatric): Secondary | ICD-10-CM | POA: Diagnosis not present

## 2023-05-05 DIAGNOSIS — E119 Type 2 diabetes mellitus without complications: Secondary | ICD-10-CM | POA: Diagnosis not present

## 2023-05-05 DIAGNOSIS — M79604 Pain in right leg: Secondary | ICD-10-CM | POA: Diagnosis not present

## 2023-05-05 DIAGNOSIS — I10 Essential (primary) hypertension: Secondary | ICD-10-CM | POA: Diagnosis not present

## 2023-05-11 ENCOUNTER — Other Ambulatory Visit: Payer: Self-pay | Admitting: Internal Medicine

## 2023-05-11 DIAGNOSIS — Z951 Presence of aortocoronary bypass graft: Secondary | ICD-10-CM

## 2023-05-11 DIAGNOSIS — M79604 Pain in right leg: Secondary | ICD-10-CM

## 2023-05-12 DIAGNOSIS — R29898 Other symptoms and signs involving the musculoskeletal system: Secondary | ICD-10-CM | POA: Diagnosis not present

## 2023-05-12 DIAGNOSIS — M79604 Pain in right leg: Secondary | ICD-10-CM | POA: Diagnosis not present

## 2023-05-12 DIAGNOSIS — M79605 Pain in left leg: Secondary | ICD-10-CM | POA: Diagnosis not present

## 2023-05-28 ENCOUNTER — Telehealth (HOSPITAL_COMMUNITY): Payer: Self-pay | Admitting: *Deleted

## 2023-05-28 ENCOUNTER — Encounter (HOSPITAL_COMMUNITY): Payer: Self-pay

## 2023-05-28 MED ORDER — METOPROLOL TARTRATE 100 MG PO TABS: ORAL_TABLET | ORAL | 0 refills | Status: DC

## 2023-05-28 NOTE — Telephone Encounter (Signed)
Reaching out to patient to offer assistance regarding upcoming cardiac imaging study; pt verbalizes understanding of appt date/time, parking situation and where to check in, pre-test NPO status and medications ordered, and verified current allergies; name and call back number provided for further questions should they arise  Merle Prescott RN Navigator Cardiac Imaging [facility] [phone] office [phone] cell  Patient to take 100mg metoprolol tartrate two hours prior to his cardiac CT scan. 

## 2023-06-01 ENCOUNTER — Ambulatory Visit
Admission: RE | Admit: 2023-06-01 | Discharge: 2023-06-01 | Disposition: A | Discharge status: Home / Self Care | Payer: Medicare HMO | Source: Ambulatory Visit | Attending: Internal Medicine | Admitting: Internal Medicine

## 2023-06-01 DIAGNOSIS — M79605 Pain in left leg: Secondary | ICD-10-CM | POA: Diagnosis not present

## 2023-06-01 DIAGNOSIS — Z951 Presence of aortocoronary bypass graft: Secondary | ICD-10-CM | POA: Diagnosis not present

## 2023-06-01 DIAGNOSIS — M79604 Pain in right leg: Secondary | ICD-10-CM | POA: Diagnosis not present

## 2023-06-01 MED ORDER — METOPROLOL TARTRATE 5 MG/5ML IV SOLN
INTRAVENOUS | Status: AC
  Filled 2023-06-01: qty 5

## 2023-06-01 MED ORDER — NITROGLYCERIN 0.4 MG SL SUBL
0.4000 mg | SUBLINGUAL_TABLET | Freq: Once | SUBLINGUAL | Status: AC
  Administered 2023-06-01: 0.4 mg via SUBLINGUAL

## 2023-06-01 MED ORDER — METOPROLOL TARTRATE 5 MG/5ML IV SOLN
5.0000 mg | Freq: Once | INTRAVENOUS | Status: AC
  Administered 2023-06-01: 5 mg via INTRAVENOUS

## 2023-06-01 MED ORDER — IOHEXOL 350 MG/ML SOLN
100.0000 mL | Freq: Once | INTRAVENOUS | Status: AC | PRN
  Administered 2023-06-01: 100 mL via INTRAVENOUS

## 2023-06-01 NOTE — Progress Notes (Signed)
This RN spoke with Dr Myriam Forehand via telephone regarding the pt's heart rate and bp readings, orders obtained to administer 0.4mg  nitroglycerin and 5mg  of metoprolol iv for the pt's test

## 2023-06-01 NOTE — Progress Notes (Signed)
Patient tolerated procedure well. Ambulate w/o difficulty. Denies any lightheadedness or being dizzy. Pt denies any pain at this time. Sitting in chair, drinking water provided. P is encouraged to drink additional water throughout the day and reason explained to patient. Patient verbalized understanding and all questions answered. ABC intact. No further needs at this time. Discharge from procedure area w/o issues.  °

## 2023-06-18 DIAGNOSIS — I10 Essential (primary) hypertension: Secondary | ICD-10-CM | POA: Diagnosis not present

## 2023-06-18 DIAGNOSIS — R29898 Other symptoms and signs involving the musculoskeletal system: Secondary | ICD-10-CM | POA: Diagnosis not present

## 2023-06-18 DIAGNOSIS — E785 Hyperlipidemia, unspecified: Secondary | ICD-10-CM | POA: Diagnosis not present

## 2023-06-18 DIAGNOSIS — G4733 Obstructive sleep apnea (adult) (pediatric): Secondary | ICD-10-CM | POA: Diagnosis not present

## 2023-06-18 DIAGNOSIS — I2510 Atherosclerotic heart disease of native coronary artery without angina pectoris: Secondary | ICD-10-CM | POA: Diagnosis not present

## 2023-06-18 DIAGNOSIS — Z951 Presence of aortocoronary bypass graft: Secondary | ICD-10-CM | POA: Diagnosis not present

## 2023-06-18 DIAGNOSIS — I493 Ventricular premature depolarization: Secondary | ICD-10-CM | POA: Diagnosis not present

## 2023-06-18 DIAGNOSIS — R001 Bradycardia, unspecified: Secondary | ICD-10-CM | POA: Diagnosis not present

## 2023-06-18 DIAGNOSIS — E119 Type 2 diabetes mellitus without complications: Secondary | ICD-10-CM | POA: Diagnosis not present

## 2023-06-22 ENCOUNTER — Telehealth: Payer: Self-pay | Admitting: Urology

## 2023-06-22 ENCOUNTER — Other Ambulatory Visit: Payer: Self-pay | Admitting: *Deleted

## 2023-06-22 MED ORDER — FINASTERIDE 5 MG PO TABS: 5.0000 mg | ORAL_TABLET | Freq: Every day | ORAL | 2 refills | Status: DC

## 2023-06-22 NOTE — Telephone Encounter (Signed)
Patient called to request refill of Finasteride 5 mg tablet. Original prescription was from Dr. Evelene Croon. Pharmacy is [facility] order.

## 2023-06-22 NOTE — Telephone Encounter (Signed)
Medication sent.

## 2023-06-30 DIAGNOSIS — I739 Peripheral vascular disease, unspecified: Secondary | ICD-10-CM | POA: Diagnosis not present

## 2023-07-15 DIAGNOSIS — Z951 Presence of aortocoronary bypass graft: Secondary | ICD-10-CM | POA: Diagnosis not present

## 2023-07-15 DIAGNOSIS — I10 Essential (primary) hypertension: Secondary | ICD-10-CM | POA: Diagnosis not present

## 2023-07-15 DIAGNOSIS — I739 Peripheral vascular disease, unspecified: Secondary | ICD-10-CM | POA: Diagnosis not present

## 2023-07-15 DIAGNOSIS — I493 Ventricular premature depolarization: Secondary | ICD-10-CM | POA: Diagnosis not present

## 2023-07-15 DIAGNOSIS — R29898 Other symptoms and signs involving the musculoskeletal system: Secondary | ICD-10-CM | POA: Diagnosis not present

## 2023-07-15 DIAGNOSIS — G4733 Obstructive sleep apnea (adult) (pediatric): Secondary | ICD-10-CM | POA: Diagnosis not present

## 2023-07-15 DIAGNOSIS — I2510 Atherosclerotic heart disease of native coronary artery without angina pectoris: Secondary | ICD-10-CM | POA: Diagnosis not present

## 2023-07-15 DIAGNOSIS — E785 Hyperlipidemia, unspecified: Secondary | ICD-10-CM | POA: Diagnosis not present

## 2023-07-15 DIAGNOSIS — E119 Type 2 diabetes mellitus without complications: Secondary | ICD-10-CM | POA: Diagnosis not present

## 2023-07-28 DIAGNOSIS — E7800 Pure hypercholesterolemia, unspecified: Secondary | ICD-10-CM | POA: Diagnosis not present

## 2023-07-28 DIAGNOSIS — E213 Hyperparathyroidism, unspecified: Secondary | ICD-10-CM | POA: Diagnosis not present

## 2023-07-28 DIAGNOSIS — E1165 Type 2 diabetes mellitus with hyperglycemia: Secondary | ICD-10-CM | POA: Diagnosis not present

## 2023-07-28 DIAGNOSIS — I10 Essential (primary) hypertension: Secondary | ICD-10-CM | POA: Diagnosis not present

## 2023-07-28 DIAGNOSIS — Z794 Long term (current) use of insulin: Secondary | ICD-10-CM | POA: Diagnosis not present

## 2023-08-04 DIAGNOSIS — G4733 Obstructive sleep apnea (adult) (pediatric): Secondary | ICD-10-CM | POA: Diagnosis not present

## 2023-08-04 DIAGNOSIS — Z8546 Personal history of malignant neoplasm of prostate: Secondary | ICD-10-CM | POA: Diagnosis not present

## 2023-08-04 DIAGNOSIS — Z951 Presence of aortocoronary bypass graft: Secondary | ICD-10-CM | POA: Diagnosis not present

## 2023-08-04 DIAGNOSIS — Z794 Long term (current) use of insulin: Secondary | ICD-10-CM | POA: Diagnosis not present

## 2023-08-04 DIAGNOSIS — Z2821 Immunization not carried out because of patient refusal: Secondary | ICD-10-CM | POA: Diagnosis not present

## 2023-08-04 DIAGNOSIS — I10 Essential (primary) hypertension: Secondary | ICD-10-CM | POA: Diagnosis not present

## 2023-08-04 DIAGNOSIS — E892 Postprocedural hypoparathyroidism: Secondary | ICD-10-CM | POA: Diagnosis not present

## 2023-08-04 DIAGNOSIS — E119 Type 2 diabetes mellitus without complications: Secondary | ICD-10-CM | POA: Diagnosis not present

## 2023-08-11 DIAGNOSIS — E1159 Type 2 diabetes mellitus with other circulatory complications: Secondary | ICD-10-CM | POA: Diagnosis not present

## 2023-08-11 DIAGNOSIS — Z794 Long term (current) use of insulin: Secondary | ICD-10-CM | POA: Diagnosis not present

## 2023-12-21 ENCOUNTER — Telehealth: Payer: Self-pay | Admitting: Urology

## 2023-12-21 NOTE — Telephone Encounter (Signed)
 Patient came into the office and needs Finasteride 5MG  tablets called into [facility] pharmacy. He has enough to get him through to Thursday.    He does have new insurance that I am putting in today.

## 2023-12-22 ENCOUNTER — Other Ambulatory Visit: Payer: Self-pay | Admitting: *Deleted

## 2023-12-22 MED ORDER — FINASTERIDE 5 MG PO TABS: 5.0000 mg | ORAL_TABLET | Freq: Every day | ORAL | 2 refills | Status: DC

## 2023-12-22 NOTE — Telephone Encounter (Signed)
Medication sent to [facility]

## 2024-04-11 ENCOUNTER — Other Ambulatory Visit: Payer: Self-pay

## 2024-04-11 DIAGNOSIS — C61 Malignant neoplasm of prostate: Secondary | ICD-10-CM

## 2024-04-15 ENCOUNTER — Other Ambulatory Visit: Payer: Self-pay

## 2024-04-20 ENCOUNTER — Encounter: Payer: Self-pay | Admitting: Urology

## 2024-04-20 ENCOUNTER — Ambulatory Visit: Payer: Self-pay | Admitting: Urology

## 2024-04-20 VITALS — BP 132/78 | HR 103 | Ht 65.0 in | Wt 160.0 lb

## 2024-04-20 DIAGNOSIS — C61 Malignant neoplasm of prostate: Secondary | ICD-10-CM

## 2024-04-20 DIAGNOSIS — N401 Enlarged prostate with lower urinary tract symptoms: Secondary | ICD-10-CM

## 2024-04-20 NOTE — Progress Notes (Signed)
 I, Maysun Jamey Mccallum, acting as a scribe for Geraline Knapp, MD., have documented all relevant documentation on the behalf of Geraline Knapp, MD, as directed by Geraline Knapp, MD while in the presence of Geraline Knapp, MD.  04/20/2024 6:23 PM   Kwamane L Brame 1946/04/11 [id number]  Referring provider: Antonio Baumgarten, MD [address] [facility] [place],  [place] [address]  Chief Complaint  Patient presents with   Prostate Cancer   Urologic history: 1. Prostate cancer  T1c low risk prostate cancer diagnosed Dr. Fredrick Jenkins 2010; PSA 12.4; 1/12 cores positive Gleason 3+3 adenocarcinoma. Biopsy 2010  Elected Active Surveillance.  2. BPH with LUTS Prostate volume at time of biopsy was 100 grams. Was having symptoms at that time and started on finasteride  5 mg daily.  HPI: Gautam L Tetzloff is a 78 y.o. male presents for annual follow-up.   No complaint since last year's visit No bothersome LUTS Remains on finasteride  Last PSA 04/12/2023 for 04/16/23 stable at 1.5 (uncorrected).    PMH: Past Medical History:  Diagnosis Date   Cancer (HCC)    PROSTATE   Diabetes mellitus without complication (HCC)    Fatigue    POSSIBLE SEIZURE  NO EVAL  NO FUTHER PROBLEM   GERD (gastroesophageal reflux disease)    ULCERS   Hypertension     Surgical History: Past Surgical History:  Procedure Laterality Date   CATARACT EXTRACTION W/PHACO Left 07/10/2015   Procedure: CATARACT EXTRACTION PHACO AND INTRAOCULAR LENS PLACEMENT (IOC);  Surgeon: Clair Crews, MD;  Location: [facility] ORS;  Service: Ophthalmology;  Laterality: Left;  US     02:00 AP% 67.6 CDE35.48 [id number] H   CATARACT EXTRACTION W/PHACO Right 07/31/2015   Procedure: CATARACT EXTRACTION PHACO AND INTRAOCULAR LENS PLACEMENT (IOC);  Surgeon: Clair Crews, MD;  Location: [facility] ORS;  Service: Ophthalmology;  Laterality: Right;  us02:17.1 ap 26.9 cde 36.87  fluid lot # [id number]   COLONOSCOPY     COLONOSCOPY WITH  PROPOFOL  N/A 08/14/2017   Procedure: COLONOSCOPY WITH PROPOFOL ;  Surgeon: Cassie Click, MD;  Location: [facility] ENDOSCOPY;  Service: Endoscopy;  Laterality: N/A;   LEFT HEART CATH AND CORONARY ANGIOGRAPHY N/A 11/27/2021   Procedure: LEFT HEART CATH AND CORONARY ANGIOGRAPHY;  Surgeon: Michelle Aid, MD;  Location: [facility] INVASIVE CV LAB;  Service: Cardiovascular;  Laterality: N/A;   PARATHYROIDECTOMY     SHOULDER ARTHROSCOPY WITH OPEN ROTATOR CUFF REPAIR Right 02/23/2018   Procedure: SHOULDER ARTHROSCOPY WITH OPEN ROTATOR CUFF REPAIR;  Surgeon: Elner Hahn, MD;  Location: [facility] ORS;  Service: Orthopedics;  Laterality: Right;   VASECTOMY      Home Medications:  Allergies as of 04/20/2024   No Known Allergies      Medication List        Accurate as of April 20, 2024  6:23 PM. If you have any questions, ask your nurse or doctor.          Accu-Chek Guide test strip Generic drug: glucose blood daily.   Accu-Chek Softclix Lancet Dev Kit Use 1 each once daily Use as instructed.   Accu-Chek Softclix Lancets lancets TEST BLOOD SUGAR EVERY DAY   acetaminophen  650 MG CR tablet Commonly known as: TYLENOL  Take 1,300 mg by mouth every 8 (eight) hours as needed for pain.   aspirin  EC 81 MG tablet Take 81 mg by mouth daily. Swallow whole.   atorvastatin 40 MG tablet Commonly known as: LIPITOR Take 40 mg by mouth daily.  cyanocobalamin 1000 MCG tablet Commonly known as: VITAMIN B12 Take 1,000 mcg by mouth daily. What changed: Another medication with the same name was removed. Continue taking this medication, and follow the directions you see here. Changed by: Geraline Knapp   Droplet Pen Needles 31G X 8 MM Misc Generic drug: Insulin  Pen Needle USE TWO TIMES DAILY   EYE VITAMINS & MINERALS PO Take 1 tablet by mouth daily.   ferrous sulfate 325 (65 FE) MG tablet Take 325 mg by mouth daily with breakfast.   finasteride  5 MG tablet Commonly known as: PROSCAR  Take 5 mg by  mouth.   finasteride  5 MG tablet Commonly known as: PROSCAR  Take 1 tablet (5 mg total) by mouth daily.   Fish Oil 1000 MG Caps Take 1,000 mg by mouth daily.   gabapentin 100 MG capsule Commonly known as: NEURONTIN Take 100 mg by mouth 3 (three) times daily.   glimepiride 4 MG tablet Commonly known as: AMARYL Take 4 mg by mouth daily with breakfast.   lisinopril 10 MG tablet Commonly known as: ZESTRIL Take 1 tablet by mouth daily. What changed: Another medication with the same name was removed. Continue taking this medication, and follow the directions you see here. Changed by: Geraline Knapp   metFORMIN 500 MG tablet Commonly known as: GLUCOPHAGE Take 1,000 mg by mouth 2 (two) times daily.   metoprolol  succinate 25 MG 24 hr tablet Commonly known as: TOPROL -XL Take 12.5 mg by mouth daily.   metoprolol  tartrate 100 MG tablet Commonly known as: LOPRESSOR  Take tablet (100mg ) TWO hours prior to your cardiac CT scan.   multivitamin with minerals Tabs tablet Take 1 tablet by mouth daily.   NovoLIN 70/30 Kwikpen (70-30) 100 UNIT/ML KwikPen Generic drug: insulin  isophane & regular human KwikPen INJECT 28 UNITS SUBCUTANEOUSLY IN THE MORNING AND 20 UNITS AT NIGHT   omeprazole 20 MG tablet Commonly known as: PRILOSEC OTC Take 20 mg by mouth every other day. In the morning.   pioglitazone 30 MG tablet Commonly known as: ACTOS Take 30 mg by mouth daily.   pravastatin 20 MG tablet Commonly known as: PRAVACHOL Take 1 tablet by mouth at bedtime.   Rybelsus 7 MG Tabs Generic drug: Semaglutide Take 1 tablet by mouth daily.   VITAMIN C PO Take 1,500 mg by mouth daily.   Vitamin D3 125 MCG (5000 UT) Caps Take 5,000 Units by mouth daily.   zinc gluconate 50 MG tablet Take 50 mg by mouth daily.        Allergies: No Known Allergies   Social History:  reports that he has never smoked. He has never used smokeless tobacco. He reports that he does not drink alcohol  and  does not use drugs.   Physical Exam: BP 132/78   Pulse (!) 103   Ht 5' 5 (1.651 m)   Wt 160 lb (72.6 kg)   BMI 26.63 kg/m   Constitutional:  Alert and oriented, No acute distress. HEENT: [place] AT, moist mucus membranes.  Trachea midline, no masses. Cardiovascular: No clubbing, cyanosis, or edema. Respiratory: Normal respiratory effort, no increased work of breathing. GI: Abdomen is soft, nontender, nondistended, no abdominal masses Skin: No rashes, bruises or suspicious lesions. Neurologic: Grossly intact, no focal deficits, moving all 4 extremities. Psychiatric: Normal mood and affect.   Assessment & Plan:    1. T1c low risk prostate cancer Stable on active surveillance Continue annual follow-up  2. BPH with LUTS No bothersome lower urinary tract symptoms on  finasteride ; did not need refills at this time.  [facility] [address], [address] [place], [place] [address] [phone]

## 2024-04-21 LAB — PSA: Prostate Specific Ag, Serum: 2.1 ng/mL (ref 0.0–4.0)

## 2024-04-24 ENCOUNTER — Ambulatory Visit: Payer: Self-pay | Admitting: Urology

## 2024-06-14 ENCOUNTER — Other Ambulatory Visit: Payer: Self-pay | Admitting: Urology

## 2024-09-20 ENCOUNTER — Other Ambulatory Visit (INDEPENDENT_AMBULATORY_CARE_PROVIDER_SITE_OTHER): Payer: Self-pay | Admitting: Nurse Practitioner

## 2024-09-20 DIAGNOSIS — R29898 Other symptoms and signs involving the musculoskeletal system: Secondary | ICD-10-CM

## 2024-09-21 ENCOUNTER — Ambulatory Visit (INDEPENDENT_AMBULATORY_CARE_PROVIDER_SITE_OTHER)

## 2024-09-21 ENCOUNTER — Ambulatory Visit (INDEPENDENT_AMBULATORY_CARE_PROVIDER_SITE_OTHER): Admitting: Nurse Practitioner

## 2024-09-21 ENCOUNTER — Encounter (INDEPENDENT_AMBULATORY_CARE_PROVIDER_SITE_OTHER): Payer: Self-pay | Admitting: Nurse Practitioner

## 2024-09-21 VITALS — BP 117/78 | HR 91 | Resp 16 | Ht 65.0 in | Wt 177.4 lb

## 2024-09-21 DIAGNOSIS — E1165 Type 2 diabetes mellitus with hyperglycemia: Secondary | ICD-10-CM

## 2024-09-21 DIAGNOSIS — R29898 Other symptoms and signs involving the musculoskeletal system: Secondary | ICD-10-CM

## 2024-09-21 DIAGNOSIS — Z794 Long term (current) use of insulin: Secondary | ICD-10-CM | POA: Diagnosis not present

## 2024-09-21 DIAGNOSIS — I10 Essential (primary) hypertension: Secondary | ICD-10-CM

## 2024-09-21 DIAGNOSIS — M6281 Muscle weakness (generalized): Secondary | ICD-10-CM | POA: Diagnosis not present

## 2024-09-21 NOTE — Progress Notes (Signed)
 SUBJECTIVE:  Patient ID: Jesse Curtis, male    DOB: [birth year], 78 y.o.   MRN: [id number] Chief Complaint  Patient presents with   New Patient (Initial Visit)    ABI + consult. Bilat Leg weakness  ref: Callwood,Dwayne    Discussed the use of AI scribe software for clinical note transcription with the patient, who gave verbal consent to proceed.  History of Present Illness Jesse Curtis is a 78 year old male who presents with lower leg weakness. He was referred by Dr. Florencio for evaluation of lower leg weakness.  He has experienced weakness in his legs for several years, which predates his open heart surgery nearly three years ago. The weakness occurs after walking approximately half a mile, necessitating a stop. Discontinuing statins has resulted in some improvement in his symptoms.  He reports that he has had some lower back pain and has not previously had his lower back evaluated as a potential cause. His legs feel tired rather than sore when standing or walking.  A recent vascular study showed normal blood flow in his legs, with right leg measurements at 1.18 and left leg at 1.12, and toe flow measurements at 0.83 and 0.87, respectively. A CTA of the abdomen and pelvis from over a year ago showed no significant plaque or blockages.  He has a history of diabetes, which was previously suggested by neurology as a contributing factor to his symptoms. He has been off atorvastatin, which he was on for years, and has noticed some improvement in leg strength since discontinuation.     Results RADIOLOGY CTA abdomen and pelvis: No plaque or blockages, normal findings (Fall 2024)  DIAGNOSTIC Ankle-brachial index: Right leg 1.18, Left leg 1.12 Toe-brachial index: Right leg 0.83, Left leg 0.87  Past Medical History:  Diagnosis Date   Cancer (HCC)    PROSTATE   Diabetes mellitus without complication (HCC)    Fatigue    POSSIBLE SEIZURE  NO EVAL  NO FUTHER PROBLEM    GERD (gastroesophageal reflux disease)    ULCERS   Hypertension     Past Surgical History:  Procedure Laterality Date   CATARACT EXTRACTION W/PHACO Left 07/10/2015   Procedure: CATARACT EXTRACTION PHACO AND INTRAOCULAR LENS PLACEMENT (IOC);  Surgeon: Elsie Carmine, MD;  Location: [facility] ORS;  Service: Ophthalmology;  Laterality: Left;  US     02:00 AP% 67.6 CDE35.48 [id number] H   CATARACT EXTRACTION W/PHACO Right 07/31/2015   Procedure: CATARACT EXTRACTION PHACO AND INTRAOCULAR LENS PLACEMENT (IOC);  Surgeon: Elsie Carmine, MD;  Location: [facility] ORS;  Service: Ophthalmology;  Laterality: Right;  us02:17.1 ap 26.9 cde 36.87  fluid lot # [id number]   COLONOSCOPY     COLONOSCOPY WITH PROPOFOL  N/A 08/14/2017   Procedure: COLONOSCOPY WITH PROPOFOL ;  Surgeon: Viktoria Lamar DASEN, MD;  Location: [facility] ENDOSCOPY;  Service: Endoscopy;  Laterality: N/A;   LEFT HEART CATH AND CORONARY ANGIOGRAPHY N/A 11/27/2021   Procedure: LEFT HEART CATH AND CORONARY ANGIOGRAPHY;  Surgeon: Hester Wolm PARAS, MD;  Location: [facility] INVASIVE CV LAB;  Service: Cardiovascular;  Laterality: N/A;   PARATHYROIDECTOMY     SHOULDER ARTHROSCOPY WITH OPEN ROTATOR CUFF REPAIR Right 02/23/2018   Procedure: SHOULDER ARTHROSCOPY WITH OPEN ROTATOR CUFF REPAIR;  Surgeon: Edie Norleen PARAS, MD;  Location: [facility] ORS;  Service: Orthopedics;  Laterality: Right;   VASECTOMY      Social History   Socioeconomic History   Marital status: Married    Spouse name: Not on file   Number of children:  Not on file   Years of education: Not on file   Highest education level: Not on file  Occupational History   Not on file  Tobacco Use   Smoking status: Never   Smokeless tobacco: Never  Vaping Use   Vaping status: Never Used  Substance and Sexual Activity   Alcohol  use: No   Drug use: No   Sexual activity: Not on file  Other Topics Concern   Not on file  Social History Narrative   Not on file   Social Drivers of Health   Financial Resource  Strain: Low Risk  (06/29/2024)   Received from [facility]   Overall Financial Resource Strain (CARDIA)    Difficulty of Paying Living Expenses: Not hard at all  Food Insecurity: No Food Insecurity (06/29/2024)   Received from [facility]   Hunger Vital Sign    Within the past 12 months, you worried that your food would run out before you got the money to buy more.: Never true    Within the past 12 months, the food you bought just didn't last and you didn't have money to get more.: Never true  Transportation Needs: No Transportation Needs (06/29/2024)   Received from [facility] - Transportation    In the past 12 months, has lack of transportation kept you from medical appointments or from getting medications?: No    Lack of Transportation (Non-Medical): No  Physical Activity: Not on file  Stress: Not on file  Social Connections: Not on file  Intimate Partner Violence: Not on file    History reviewed. No pertinent family history.  No Known Allergies   Review of Systems   Review of Systems: Negative Unless Checked Constitutional: [] Weight loss  [] Fever  [] Chills Cardiac: [] Chest pain   []  Atrial Fibrillation  [] Palpitations   [] Shortness of breath when laying flat   [] Shortness of breath with exertion. [] Shortness of breath at rest Vascular:  [] Pain in legs with walking   [] Pain in legs with standing [] Pain in legs when laying flat   [] Claudication    [] Pain in feet when laying flat    [] History of DVT   [] Phlebitis   [] Swelling in legs   [] Varicose veins   [] Non-healing ulcers Pulmonary:   [] Uses home oxygen    [] Productive cough   [] Hemoptysis   [] Wheeze  [] COPD   [] Asthma Neurologic:  [] Dizziness   [] Seizures  [] Blackouts [] History of stroke   [] History of TIA  [] Aphasia   [] Temporary Blindness   [] Weakness or numbness in arm   [x] Weakness or numbness in leg Musculoskeletal:   [] Joint swelling   [] Joint pain   [] Low back  pain  []  History of Knee Replacement [] Arthritis [] back Surgeries  []  Spinal Stenosis    Hematologic:  [] Easy bruising  [] Easy bleeding   [] Hypercoagulable state   [] Anemic Gastrointestinal:  [] Diarrhea   [] Vomiting  [] Gastroesophageal reflux/heartburn   [] Difficulty swallowing. [] Abdominal pain Genitourinary:  [] Chronic kidney disease   [] Difficult urination  [] Anuric   [] Blood in urine [] Frequent urination  [] Burning with urination   [] Hematuria Skin:  [] Rashes   [] Ulcers [] Wounds Psychological:  [] History of anxiety   []  History of major depression  []  Memory Difficulties      OBJECTIVE:     BP 117/78   Pulse 91   Resp 16   Ht 5' 5 (1.651 m)   Wt 177 lb 6.4 oz (80.5 kg)   BMI 29.52 kg/m  Physical Exam Physical Exam     CMP     Component Value Date/Time   NA 140 05/12/2022 1859   K 4.3 05/12/2022 1859   CL 100 05/12/2022 1859   CO2 25 05/12/2022 1859   GLUCOSE 190 (H) 05/12/2022 1859   BUN 16 05/12/2022 1859   CREATININE 0.89 05/12/2022 1859   CALCIUM 10.1 05/12/2022 1859   PROT 7.5 05/12/2022 1859   ALBUMIN 4.3 05/12/2022 1859   AST 32 05/12/2022 1859   ALT 31 05/12/2022 1859   ALKPHOS 87 05/12/2022 1859   BILITOT 1.1 05/12/2022 1859   GFRNONAA >60 05/12/2022 1859    No results found.     ASSESSMENT AND PLAN:  1. Muscle weakness of lower extremity (Primary) Lower extremity exertional weakness evaluation Chronic exertional leg weakness with normal vascular studies. Differential includes vascular issues, lumbar spine pathology, and diabetes-related symptoms. 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 - Referred to neurosurgery for lumbar spine evaluation. - Monitor symptoms, reevaluate if worsens or new symptoms develop. - Ambulatory referral to Neurosurgery  2. Benign essential HTN Continue antihypertensive medications as already ordered, these medications have been reviewed and there are no changes at this  time.  3. Type 2 diabetes mellitus with hyperglycemia, with long-term current use of insulin  (HCC) Continue hypoglycemic medications as already ordered, these medications have been reviewed and there are no changes at this time.  Hgb A1C to be monitored as already arranged by primary service     Current Outpatient Medications on File Prior to Visit  Medication Sig Dispense Refill   acetaminophen  (TYLENOL ) 650 MG CR tablet Take 1,300 mg by mouth every 8 (eight) hours as needed for pain.     Ascorbic Acid (VITAMIN C PO) Take 1,500 mg by mouth daily.     aspirin  EC 81 MG tablet Take 81 mg by mouth daily. Swallow whole.     Cholecalciferol (VITAMIN D3) 125 MCG (5000 UT) CAPS Take 5,000 Units by mouth daily.     clonazePAM (KLONOPIN) 0.5 MG tablet Take 0.5 mg by mouth daily.     DROPLET PEN NEEDLES 31G X 8 MM MISC USE TWO TIMES DAILY     ferrous sulfate 325 (65 FE) MG tablet Take 325 mg by mouth daily with breakfast.     finasteride  (PROSCAR ) 5 MG tablet Take 5 mg by mouth.     finasteride  (PROSCAR ) 5 MG tablet TAKE 1 TABLET BY MOUTH DAILY 90 tablet 2   gabapentin (NEURONTIN) 100 MG capsule Take 100 mg by mouth 3 (three) times daily.     insulin  isophane & regular human KwikPen (NOVOLIN 70/30 KWIKPEN) (70-30) 100 UNIT/ML KwikPen INJECT 28 UNITS SUBCUTANEOUSLY IN THE MORNING AND 20 UNITS AT NIGHT     Lancets Misc. (ACCU-CHEK SOFTCLIX LANCET DEV) KIT Use 1 each once daily Use as instructed.     lisinopril (ZESTRIL) 10 MG tablet Take 1 tablet by mouth daily.     metFORMIN (GLUCOPHAGE) 500 MG tablet Take 1,000 mg by mouth 2 (two) times daily.     metoprolol  succinate (TOPROL -XL) 25 MG 24 hr tablet Take 12.5 mg by mouth daily.     metoprolol  tartrate (LOPRESSOR ) 100 MG tablet Take tablet (100mg ) TWO hours prior to your cardiac CT scan. 1 tablet 0   Multiple Vitamin (MULTIVITAMIN WITH MINERALS) TABS tablet Take 1 tablet by mouth daily.     Multiple Vitamins-Minerals (EYE VITAMINS & MINERALS PO)  Take 1 tablet by mouth daily.     Omega-3  Fatty Acids (FISH OIL) 1000 MG CAPS Take 1,000 mg by mouth daily.     omeprazole (PRILOSEC OTC) 20 MG tablet Take 20 mg by mouth every other day. In the morning.     RYBELSUS 7 MG TABS Take 1 tablet by mouth daily.     ACCU-CHEK GUIDE test strip daily. (Patient not taking: Reported on 09/21/2024)     Accu-Chek Softclix Lancets lancets TEST BLOOD SUGAR EVERY DAY (Patient not taking: Reported on 09/21/2024)     atorvastatin (LIPITOR) 40 MG tablet Take 40 mg by mouth daily. (Patient not taking: Reported on 09/21/2024)     glimepiride (AMARYL) 4 MG tablet Take 4 mg by mouth daily with breakfast. (Patient not taking: Reported on 09/21/2024)     pioglitazone (ACTOS) 30 MG tablet Take 30 mg by mouth daily. (Patient not taking: Reported on 09/21/2024)     pravastatin (PRAVACHOL) 20 MG tablet Take 1 tablet by mouth at bedtime. (Patient not taking: Reported on 09/21/2024)     vitamin B-12 (CYANOCOBALAMIN) 1000 MCG tablet Take 1,000 mcg by mouth daily.     zinc gluconate 50 MG tablet Take 50 mg by mouth daily.     No current facility-administered medications on file prior to visit.    There are no Patient Instructions on file for this visit. No follow-ups on file.   Noralee Dutko E Mcdonald Reiling, NP  This note was completed with Office Manager.  Any errors are purely unintentional.

## 2024-09-26 LAB — VAS US ABI WITH/WO TBI
Left ABI: 1.12
Right ABI: 1.18

## 2024-10-07 ENCOUNTER — Other Ambulatory Visit: Payer: Self-pay | Admitting: Neurology

## 2024-10-07 DIAGNOSIS — R29818 Other symptoms and signs involving the nervous system: Secondary | ICD-10-CM

## 2024-10-07 DIAGNOSIS — R29898 Other symptoms and signs involving the musculoskeletal system: Secondary | ICD-10-CM

## 2024-10-07 DIAGNOSIS — R5381 Other malaise: Secondary | ICD-10-CM

## 2024-10-07 DIAGNOSIS — R2689 Other abnormalities of gait and mobility: Secondary | ICD-10-CM

## 2024-10-08 ENCOUNTER — Ambulatory Visit
Admission: RE | Admit: 2024-10-08 | Discharge: 2024-10-08 | Disposition: A | Source: Ambulatory Visit | Attending: Neurology | Admitting: Neurology

## 2024-10-08 DIAGNOSIS — R2689 Other abnormalities of gait and mobility: Secondary | ICD-10-CM

## 2024-10-08 DIAGNOSIS — R29818 Other symptoms and signs involving the nervous system: Secondary | ICD-10-CM

## 2024-10-08 DIAGNOSIS — R5381 Other malaise: Secondary | ICD-10-CM

## 2024-10-08 DIAGNOSIS — R29898 Other symptoms and signs involving the musculoskeletal system: Secondary | ICD-10-CM

## 2024-11-01 NOTE — Progress Notes (Signed)
 "  Referring Physician:  Delores Orvin BRAVO, NP [address] [place],  [place] [address]  Primary Physician:  Sadie Manna, MD  History of Present Illness: 11/08/2024 Mr. Jeffie Gudiel is here today with a chief complaint of bilateral lower extremity weakness 5 years.  Patient denies back pain, but has noticed progressive limitations with his walking.  He is not able to walk the distance he once did and his legs feel heavy, stiff, and tired. One leg is not worse than the other. He denies any pain, numbness, or tingling in his legs.  Denies any saddle anesthesia or changes to bowel or bladder.   Duration: 5 years  Bowel/Bladder Dysfunction: none  Conservative measures:  Physical therapy:  Has participated in @[facility] .  Multimodal medical therapy including regular antiinflammatories:  acetaminophen , gabapentin  Injections: no  epidural steroid injections  Past Surgery: none   The symptoms are causing a significant impact on the patient's life.   Review of Systems:  A 10 point review of systems is negative, except for the pertinent positives and negatives detailed in the HPI.  Past Medical History: Past Medical History:  Diagnosis Date   Cancer (HCC)    PROSTATE   Diabetes mellitus without complication (HCC)    Fatigue    POSSIBLE SEIZURE  NO EVAL  NO FUTHER PROBLEM   GERD (gastroesophageal reflux disease)    ULCERS   Hypertension     Past Surgical History: Past Surgical History:  Procedure Laterality Date   CATARACT EXTRACTION W/PHACO Left 07/10/2015   Procedure: CATARACT EXTRACTION PHACO AND INTRAOCULAR LENS PLACEMENT (IOC);  Surgeon: Elsie Carmine, MD;  Location: [facility] ORS;  Service: Ophthalmology;  Laterality: Left;  US     02:00 AP% 67.6 CDE35.48 [id number] H   CATARACT EXTRACTION W/PHACO Right 07/31/2015   Procedure: CATARACT EXTRACTION PHACO AND INTRAOCULAR LENS PLACEMENT (IOC);  Surgeon: Elsie Carmine, MD;  Location: [facility] ORS;  Service: Ophthalmology;   Laterality: Right;  us02:17.1 ap 26.9 cde 36.87  fluid lot # [id number]   COLONOSCOPY     COLONOSCOPY WITH PROPOFOL  N/A 08/14/2017   Procedure: COLONOSCOPY WITH PROPOFOL ;  Surgeon: Viktoria Lamar DASEN, MD;  Location: [facility] ENDOSCOPY;  Service: Endoscopy;  Laterality: N/A;   LEFT HEART CATH AND CORONARY ANGIOGRAPHY N/A 11/27/2021   Procedure: LEFT HEART CATH AND CORONARY ANGIOGRAPHY;  Surgeon: Hester Wolm PARAS, MD;  Location: [facility] INVASIVE CV LAB;  Service: Cardiovascular;  Laterality: N/A;   PARATHYROIDECTOMY     SHOULDER ARTHROSCOPY WITH OPEN ROTATOR CUFF REPAIR Right 02/23/2018   Procedure: SHOULDER ARTHROSCOPY WITH OPEN ROTATOR CUFF REPAIR;  Surgeon: Edie Norleen PARAS, MD;  Location: [facility] ORS;  Service: Orthopedics;  Laterality: Right;   VASECTOMY      Allergies: Allergies as of 11/08/2024   (No Known Allergies)    Medications: Outpatient Encounter Medications as of 11/08/2024  Medication Sig   ACCU-CHEK GUIDE test strip daily. (Patient not taking: Reported on 09/21/2024)   Accu-Chek Softclix Lancets lancets TEST BLOOD SUGAR EVERY DAY (Patient not taking: Reported on 09/21/2024)   acetaminophen  (TYLENOL ) 650 MG CR tablet Take 1,300 mg by mouth every 8 (eight) hours as needed for pain.   Ascorbic Acid (VITAMIN C PO) Take 1,500 mg by mouth daily.   aspirin  EC 81 MG tablet Take 81 mg by mouth daily. Swallow whole.   atorvastatin (LIPITOR) 40 MG tablet Take 40 mg by mouth daily. (Patient not taking: Reported on 09/21/2024)   Cholecalciferol (VITAMIN D3) 125 MCG (5000 UT) CAPS Take 5,000  Units by mouth daily.   clonazePAM (KLONOPIN) 0.5 MG tablet Take 0.5 mg by mouth daily.   DROPLET PEN NEEDLES 31G X 8 MM MISC USE TWO TIMES DAILY   ferrous sulfate 325 (65 FE) MG tablet Take 325 mg by mouth daily with breakfast.   finasteride  (PROSCAR ) 5 MG tablet Take 5 mg by mouth.   finasteride  (PROSCAR ) 5 MG tablet TAKE 1 TABLET BY MOUTH DAILY   gabapentin (NEURONTIN) 100 MG capsule Take 100 mg by mouth 3  (three) times daily.   glimepiride (AMARYL) 4 MG tablet Take 4 mg by mouth daily with breakfast. (Patient not taking: Reported on 09/21/2024)   insulin  isophane & regular human KwikPen (NOVOLIN 70/30 KWIKPEN) (70-30) 100 UNIT/ML KwikPen INJECT 28 UNITS SUBCUTANEOUSLY IN THE MORNING AND 20 UNITS AT NIGHT   Lancets Misc. (ACCU-CHEK SOFTCLIX LANCET DEV) KIT Use 1 each once daily Use as instructed.   lisinopril (ZESTRIL) 10 MG tablet Take 1 tablet by mouth daily.   metFORMIN (GLUCOPHAGE) 500 MG tablet Take 1,000 mg by mouth 2 (two) times daily.   metoprolol  succinate (TOPROL -XL) 25 MG 24 hr tablet Take 12.5 mg by mouth daily.   metoprolol  tartrate (LOPRESSOR ) 100 MG tablet Take tablet (100mg ) TWO hours prior to your cardiac CT scan.   Multiple Vitamin (MULTIVITAMIN WITH MINERALS) TABS tablet Take 1 tablet by mouth daily.   Multiple Vitamins-Minerals (EYE VITAMINS & MINERALS PO) Take 1 tablet by mouth daily.   Omega-3 Fatty Acids (FISH OIL) 1000 MG CAPS Take 1,000 mg by mouth daily.   omeprazole (PRILOSEC) 20 MG capsule Take 20 mg by mouth.   pioglitazone (ACTOS) 30 MG tablet Take 30 mg by mouth daily. (Patient not taking: Reported on 09/21/2024)   pravastatin (PRAVACHOL) 20 MG tablet Take 1 tablet by mouth at bedtime. (Patient not taking: Reported on 09/21/2024)   RYBELSUS 7 MG TABS Take 1 tablet by mouth daily.   vitamin B-12 (CYANOCOBALAMIN) 1000 MCG tablet Take 1,000 mcg by mouth daily.   zinc gluconate 50 MG tablet Take 50 mg by mouth daily.   [DISCONTINUED] omeprazole (PRILOSEC OTC) 20 MG tablet Take 20 mg by mouth every other day. In the morning.   No facility-administered encounter medications on file as of 11/08/2024.    Social History: Social History[1]  Family Medical History: No family history on file.  Physical Examination: @VITALWITHPAIN @  General: Patient is well developed, well nourished, calm, collected, and in no apparent distress. Attention to examination is  appropriate.  Psychiatric: Patient is non-anxious.  Head:  Pupils equal, round, and reactive to light.  ENT:  Oral mucosa appears well hydrated.  Neck:   Supple.  Full range of motion.  Respiratory: Patient is breathing without any difficulty.  Extremities: No edema.  Vascular: Palpable dorsal pedal pulses.  Skin:   On exposed skin, there are no abnormal skin lesions.  NEUROLOGICAL:     Awake, alert, oriented to person, place, and time.  Speech is clear and fluent. Fund of knowledge is appropriate.   Cranial Nerves: Pupils equal round and reactive to light.  Facial tone is symmetric.   ROM of spine: No tenderness to  palpation of lumbar spine.  Strength:  Side Iliopsoas Quads Hamstring PF DF EHL  R 5 5 5 5 5 5   L 5 5 5 5 5 5    Reflexes are 1+ bilateral patella, trace Achilles bilaterally.  Clonus is not present.  Gait is normal.  Medical Decision Making  Imaging: EXAM: MRI LUMBAR SPINE  WITHOUT CONTRAST   TECHNIQUE: Multiplanar, multisequence MR imaging of the lumbar spine was performed. No intravenous contrast was administered.   COMPARISON:  None Available.   FINDINGS: Segmentation:  Standard.   Alignment:  Physiologic.   Vertebrae: No acute fracture, evidence of discitis, or aggressive bone lesion.   Conus medullaris and cauda equina: Conus extends to the T12-L1 level. Conus and cauda equina appear normal.   Paraspinal and other soft tissues: No acute paraspinal abnormality.   Disc levels:   Disc spaces: Severe disc height loss at L4-5 and L5-S1. Moderate disc height loss at L3-4.   T12-L1: No significant disc bulge. No neural foraminal stenosis. No central canal stenosis.   L1-L2: Tiny left paracentral disc protrusion. Mild bilateral facet arthropathy. No foraminal or central canal stenosis.   L2-L3: Moderate disc bulge. Moderate bilateral facet arthropathy. Moderate-severe central canal stenosis. No foraminal stenosis.   L3-L4: Mild disc  bulge with a small central disc protrusion. Moderate right and mild left facet arthropathy. Mild right lateral recess stenosis. Mild bilateral foraminal stenosis. Mild central canal stenosis.   L4-L5: Mild disc bulge. Moderate bilateral facet arthropathy. Mild central canal stenosis. Bilateral subarticular recess stenosis. Mild bilateral foraminal stenosis.   L5-S1: Minimal disc bulge. Mild bilateral facet arthropathy. Mild right and moderate left foraminal stenosis. No central canal stenosis.   IMPRESSION: 1. At L2-3 there is a moderate disc bulge. Moderate bilateral facet arthropathy. Moderate-severe central canal stenosis. 2. At L3-4 there is a mild disc bulge with a small central disc protrusion. Moderate right and mild left facet arthropathy. Mild right lateral recess stenosis. Mild bilateral foraminal stenosis. Mild central canal stenosis. 3. At L4-5 there is a mild disc bulge. Moderate bilateral facet arthropathy. Mild central canal stenosis. Bilateral subarticular recess stenosis. Mild bilateral foraminal stenosis. 4. At L5-S1 there is a minimal disc bulge. Mild bilateral facet arthropathy. Mild right and moderate left foraminal stenosis. 5. No acute osseous injury of the lumbar spine.  I have personally reviewed the images and agree with the above interpretation.  Assessment and Plan: Mr. Fishburn is a pleasant 78 y.o. 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  He denies any back pain or radiating leg pain numbness or tingling.  He has went through physical therapy, but his lower extremity weakness has persisted.  Plan includes the following moving forward:  - Plan for 4 view lumbar films today to include flexion-extension.  Patient does have some listhesis seen on MRI and would like to evaluate for any dynamic listhesis seen. - Will plan for patient  to undergo bone density scan for surgical planning - Will set up follow-up with surgeon to discuss surgery as he has completed physical therapy and has significant stenosis seen  -Counseled patient on his A1c control and how this could relate to both surgical planning and healing. - Encouraged him to reach out to me in the meantime for any questions or concerns.    Thank you for involving me in the care of this patient.   I spent a total of 45 minutes in both face-to-face and non-face-to-face activities for this visit on the date of this encounter including preparing to see the patient, obtaining and reviewing separately obtained history, performing medically appropriate examination, counseling the patient and their family, ordering additional tests, documenting clinical information, independently interpreting results, coordination of care.   Lyle Decamp, PA-C Dept. of Neurosurgery     [  1]  Social History Tobacco Use   Smoking status: Never   Smokeless tobacco: Never  Vaping Use   Vaping status: Never Used  Substance Use Topics   Alcohol  use: No   Drug use: No   "

## 2024-11-08 ENCOUNTER — Ambulatory Visit: Admitting: Physician Assistant

## 2024-11-08 ENCOUNTER — Ambulatory Visit

## 2024-11-08 VITALS — BP 120/68 | Ht 65.0 in | Wt 176.0 lb

## 2024-11-08 DIAGNOSIS — M48062 Spinal stenosis, lumbar region with neurogenic claudication: Secondary | ICD-10-CM | POA: Diagnosis not present

## 2024-11-29 ENCOUNTER — Ambulatory Visit
Admission: RE | Admit: 2024-11-29 | Discharge: 2024-11-29 | Disposition: A | Discharge status: Home / Self Care | Source: Ambulatory Visit | Attending: Physician Assistant | Admitting: Physician Assistant

## 2024-11-29 DIAGNOSIS — Z01818 Encounter for other preprocedural examination: Secondary | ICD-10-CM | POA: Diagnosis present

## 2024-11-29 DIAGNOSIS — M48062 Spinal stenosis, lumbar region with neurogenic claudication: Secondary | ICD-10-CM | POA: Insufficient documentation

## 2024-11-29 DIAGNOSIS — M8580 Other specified disorders of bone density and structure, unspecified site: Secondary | ICD-10-CM | POA: Diagnosis not present

## 2024-11-29 DIAGNOSIS — Z87310 Personal history of (healed) osteoporosis fracture: Secondary | ICD-10-CM | POA: Diagnosis not present

## 2024-11-29 NOTE — Progress Notes (Signed)
 "  Referring Physician:  Delores Orvin BRAVO, NP [address] [place],  [place] [address]  Primary Physician:  Sadie Manna, MD   Discussed the use of AI scribe software for clinical note transcription with the patient, who gave verbal consent to proceed.  History of Present Illness Jesse Curtis is a 79 year old male with lumbar spinal stenosis who presents for evaluation of progressive lower extremity heaviness and neurogenic claudication.  He has progressive heaviness, fatigue, and cramping in both legs with ambulation, starting after walking about half a mile. He must stop and rest at that point. Symptoms improve with sitting and rest. He does not have leg heaviness with prolonged standing and denies back pain at baseline or with activity. He notes slower walking speed and frequent rest breaks but can complete activities at a slower pace and uses a cane for longer walks. He denies numbness, focal weakness, abrupt neurologic loss, or skin changes in the legs.  He completed vascular evaluation including imaging and ankle-brachial indices, which were negative for vascular disease. He started but did not complete physical therapy due to plantar fasciitis and an ankle injury.  He has type 2 diabetes with recent hemoglobin A1c around 8 and is considering starting Mounjaro for weight loss and glycemic control.  Conservative measures:  Physical therapy:  Has participated in @[facility] .  Multimodal medical therapy including regular antiinflammatories:  acetaminophen , gabapentin  Injections: no  epidural steroid injections  Past Surgery: none   The symptoms are causing a significant impact on the patient's life.   Review of Systems:  A 10 point review of systems is negative, except for the pertinent positives and negatives detailed in the HPI.  Past Medical History: Past Medical History:  Diagnosis Date   Cancer (HCC)    PROSTATE   Diabetes mellitus without  complication (HCC)    Fatigue    POSSIBLE SEIZURE  NO EVAL  NO FUTHER PROBLEM   GERD (gastroesophageal reflux disease)    ULCERS   Hypertension     Past Surgical History: Past Surgical History:  Procedure Laterality Date   CATARACT EXTRACTION W/PHACO Left 07/10/2015   Procedure: CATARACT EXTRACTION PHACO AND INTRAOCULAR LENS PLACEMENT (IOC);  Surgeon: Elsie Carmine, MD;  Location: [facility] ORS;  Service: Ophthalmology;  Laterality: Left;  US     02:00 AP% 67.6 CDE35.48 [id number] H   CATARACT EXTRACTION W/PHACO Right 07/31/2015   Procedure: CATARACT EXTRACTION PHACO AND INTRAOCULAR LENS PLACEMENT (IOC);  Surgeon: Elsie Carmine, MD;  Location: [facility] OR[facility];  Service: Ophthalmology;  Laterality: Right;  [id number]:17.1 ap 26.9 cde 36.87  fluid lot # [id number]   COLONOSCOPY     COLONOSCOPY WITH PROPOFOL  N/A 08/14/2017   Procedure: COLONOSCOPY WITH PROPOFOL ;  Surgeon: Viktoria Lamar DASEN, MD;  Location: [facility];  Service: Endoscopy;  Laterality: N/A;   LEFT HEART CATH AND CORONARY ANGIOGRAPHY N/A 11/27/2021   Procedure: LEFT HEART CATH AND CORONARY ANGIOGRAPHY;  Surgeon: Hester Wolm PARAS, MD;  Location: [facility] INVASIVE [facility];  Service: Cardiovascular;  Laterality: N/A;   PARATHYROIDECTOMY     SHOULDER ARTHROSCOPY WITH OPEN ROTATOR CUFF REPAIR Right 02/23/2018   Procedure: SHOULDER ARTHROSCOPY WITH OPEN ROTATOR CUFF REPAIR;  Surgeon: Edie Norleen PARAS, MD;  Location: [facility] ORS;  Service: Orthopedics;  Laterality: Right;   VASECTOMY      Allergies: Allergies as of 11/08/2024   (No Known Allergies)    Medications: Outpatient Encounter Medications as of 11/08/2024  Medication Sig   ACCU-CHEK GUIDE test strip  daily. (Patient not taking: Reported on 09/21/2024)   Accu-Chek Softclix Lancets lancets TEST BLOOD SUGAR EVERY DAY (Patient not taking: Reported on 09/21/2024)   acetaminophen  (TYLENOL ) 650 MG CR tablet Take 1,300 mg by mouth every 8 (eight) hours as needed for pain.   Ascorbic Acid (VITAMIN C  PO) Take 1,500 mg by mouth daily.   aspirin  EC 81 MG tablet Take 81 mg by mouth daily. Swallow whole.   atorvastatin (LIPITOR) 40 MG tablet Take 40 mg by mouth daily. (Patient not taking: Reported on 09/21/2024)   Cholecalciferol (VITAMIN D3) 125 MCG (5000 UT) CAPS Take 5,000 Units by mouth daily.   clonazePAM (KLONOPIN) 0.5 MG tablet Take 0.5 mg by mouth daily.   DROPLET PEN NEEDLES 31G X 8 MM MISC USE TWO TIMES DAILY   ferrous sulfate 325 (65 FE) MG tablet Take 325 mg by mouth daily with breakfast.   finasteride  (PROSCAR ) 5 MG tablet Take 5 mg by mouth.   finasteride  (PROSCAR ) 5 MG tablet TAKE 1 TABLET BY MOUTH DAILY   gabapentin (NEURONTIN) 100 MG capsule Take 100 mg by mouth 3 (three) times daily.   glimepiride (AMARYL) 4 MG tablet Take 4 mg by mouth daily with breakfast. (Patient not taking: Reported on 09/21/2024)   insulin  isophane & regular human KwikPen (NOVOLIN 70/30 KWIKPEN) (70-30) 100 UNIT/ML KwikPen INJECT 28 UNITS SUBCUTANEOUSLY IN THE MORNING AND 20 UNITS AT NIGHT   Lancets Misc. (ACCU-CHEK SOFTCLIX LANCET DEV) KIT Use 1 each once daily Use as instructed.   lisinopril (ZESTRIL) 10 MG tablet Take 1 tablet by mouth daily.   metFORMIN (GLUCOPHAGE) 500 MG tablet Take 1,000 mg by mouth 2 (two) times daily.   metoprolol  succinate (TOPROL -XL) 25 MG 24 hr tablet Take 12.5 mg by mouth daily.   metoprolol  tartrate (LOPRESSOR ) 100 MG tablet Take tablet (100mg ) TWO hours prior to your cardiac CT scan.   Multiple Vitamin (MULTIVITAMIN WITH MINERALS) TABS tablet Take 1 tablet by mouth daily.   Multiple Vitamins-Minerals (EYE VITAMINS & MINERALS PO) Take 1 tablet by mouth daily.   Omega-3 Fatty Acids (FISH OIL) 1000 MG CAPS Take 1,000 mg by mouth daily.   omeprazole (PRILOSEC) 20 MG capsule Take 20 mg by mouth.   pioglitazone (ACTOS) 30 MG tablet Take 30 mg by mouth daily. (Patient not taking: Reported on 09/21/2024)   pravastatin (PRAVACHOL) 20 MG tablet Take 1 tablet by mouth at bedtime.  (Patient not taking: Reported on 09/21/2024)   RYBELSUS 7 MG TABS Take 1 tablet by mouth daily.   vitamin B-12 (CYANOCOBALAMIN) 1000 MCG tablet Take 1,000 mcg by mouth daily.   zinc gluconate 50 MG tablet Take 50 mg by mouth daily.   [DISCONTINUED] omeprazole (PRILOSEC OTC) 20 MG tablet Take 20 mg by mouth every other day. In the morning.   No facility-administered encounter medications on file as of 11/08/2024.    Social History: [Social History]  [Social History] Tobacco Use   Smoking status: Never   Smokeless tobacco: Never  Vaping Use   Vaping status: Never Used  Substance Use Topics   Alcohol  use: No   Drug use: No    Family Medical History: No family history on file.  Physical Examination: NEUROLOGICAL:     Awake, alert, oriented to person, place, and time.  Speech is clear and fluent. Fund of knowledge is appropriate.   Cranial Nerves: Pupils equal round and reactive to light.  Facial tone is symmetric.   ROM of spine: No tenderness to  palpation  of lumbar spine.  Strength:  Side Iliopsoas Quads Hamstring PF DF EHL  R 5 5 5 5 5 5   L 5 5 5 5 5 5    Reflexes are 1+ bilateral patella, trace Achilles bilaterally.  Clonus is not present.  Gait is normal.  Medical Decision Making  Imaging: EXAM: MRI LUMBAR SPINE WITHOUT CONTRAST   TECHNIQUE: Multiplanar, multisequence MR imaging of the lumbar spine was performed. No intravenous contrast was administered.   COMPARISON:  None Available.   FINDINGS: Segmentation:  Standard.   Alignment:  Physiologic.   Vertebrae: No acute fracture, evidence of discitis, or aggressive bone lesion.   Conus medullaris and cauda equina: Conus extends to the T12-L1 level. Conus and cauda equina appear normal.   Paraspinal and other soft tissues: No acute paraspinal abnormality.   Disc levels:   Disc spaces: Severe disc height loss at L4-5 and L5-S1. Moderate disc height loss at L3-4.   T12-L1: No significant disc bulge.  No neural foraminal stenosis. No central canal stenosis.   L1-L2: Tiny left paracentral disc protrusion. Mild bilateral facet arthropathy. No foraminal or central canal stenosis.   L2-L3: Moderate disc bulge. Moderate bilateral facet arthropathy. Moderate-severe central canal stenosis. No foraminal stenosis.   L3-L4: Mild disc bulge with a small central disc protrusion. Moderate right and mild left facet arthropathy. Mild right lateral recess stenosis. Mild bilateral foraminal stenosis. Mild central canal stenosis.   L4-L5: Mild disc bulge. Moderate bilateral facet arthropathy. Mild central canal stenosis. Bilateral subarticular recess stenosis. Mild bilateral foraminal stenosis.   L5-S1: Minimal disc bulge. Mild bilateral facet arthropathy. Mild right and moderate left foraminal stenosis. No central canal stenosis.   IMPRESSION: 1. At L2-3 there is a moderate disc bulge. Moderate bilateral facet arthropathy. Moderate-severe central canal stenosis. 2. At L3-4 there is a mild disc bulge with a small central disc protrusion. Moderate right and mild left facet arthropathy. Mild right lateral recess stenosis. Mild bilateral foraminal stenosis. Mild central canal stenosis. 3. At L4-5 there is a mild disc bulge. Moderate bilateral facet arthropathy. Mild central canal stenosis. Bilateral subarticular recess stenosis. Mild bilateral foraminal stenosis. 4. At L5-S1 there is a minimal disc bulge. Mild bilateral facet arthropathy. Mild right and moderate left foraminal stenosis. 5. No acute osseous injury of the lumbar spine.  I have personally reviewed the images and agree with the above interpretation.  Assessment and Plan Assessment & Plan Lumbar spinal stenosis with neurogenic claudication Chronic, progressive lumbar spinal stenosis is causing neurogenic claudication characterized by leg heaviness and reduced walking endurance. 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 He has no significant back pain, which is atypical for this degree of stenosis. Surgical decompression could improve leg symptoms but carries a substantial risk of new or worsened postoperative back pain, particularly since he currently has no back discomfort. Diabetes may contribute to nerve dysfunction and increases surgical risk. Physical therapy may improve core strength but will not alter the underlying stenosis. Improved glycemic control and weight loss may enhance nerve health and stamina but will not reverse the stenosis. - Recommended six month follow-up to reassess symptoms and functional status. - Advised to return earlier if symptoms worsen before six months. - Discussed surgical decompression as a potential intervention if symptoms progress, with the understanding that surgery may improve leg symptoms but carries a risk of postoperative back pain, especially since he currently has no back pain. - Advised that physical therapy may improve core strength but will not change  the stenosis, but could overall be a good benefit for him. - Discussed that improved glycemic control and weight loss may improve nerve health and stamina - Provided guidance to avoid loaded bending, lifting, and twisting movements to minimize risk of exacerbating spinal symptoms; general household movements and lifting without twisting are acceptable.  Penne LELON Sharps MD  Spent a total of 30 minutes with this patient today and including reviewing films, obtaining history and physical exam, coordination of care going forward "

## 2024-12-07 ENCOUNTER — Encounter: Payer: Self-pay | Admitting: Neurosurgery

## 2024-12-07 ENCOUNTER — Ambulatory Visit: Admitting: Neurosurgery

## 2024-12-07 VITALS — BP 124/70 | Ht 65.0 in | Wt 177.0 lb

## 2024-12-07 DIAGNOSIS — M48062 Spinal stenosis, lumbar region with neurogenic claudication: Secondary | ICD-10-CM | POA: Diagnosis not present

## 2025-04-19 ENCOUNTER — Ambulatory Visit: Admitting: Urology

## 2025-06-07 ENCOUNTER — Ambulatory Visit: Admitting: Neurosurgery
